# Patient Record
Sex: Female | Born: 1950 | Race: Black or African American | Hispanic: No | Smoking: Never smoker
Health system: Southern US, Community
[De-identification: ages and names within clinical notes are randomized; demographics above are authoritative.]

## PROBLEM LIST (undated history)

## (undated) DIAGNOSIS — M1990 Unspecified osteoarthritis, unspecified site: Secondary | ICD-10-CM

## (undated) DIAGNOSIS — E782 Mixed hyperlipidemia: Secondary | ICD-10-CM

## (undated) DIAGNOSIS — E079 Disorder of thyroid, unspecified: Secondary | ICD-10-CM

## (undated) DIAGNOSIS — R05 Cough: Secondary | ICD-10-CM

## (undated) DIAGNOSIS — I10 Essential (primary) hypertension: Secondary | ICD-10-CM

## (undated) DIAGNOSIS — E039 Hypothyroidism, unspecified: Secondary | ICD-10-CM

## (undated) DIAGNOSIS — D649 Anemia, unspecified: Secondary | ICD-10-CM

## (undated) DIAGNOSIS — E559 Vitamin D deficiency, unspecified: Secondary | ICD-10-CM

## (undated) DIAGNOSIS — B019 Varicella without complication: Secondary | ICD-10-CM

## (undated) HISTORY — DX: Unspecified osteoarthritis, unspecified site: M19.90

## (undated) HISTORY — DX: Hypothyroidism, unspecified: E03.9

## (undated) HISTORY — DX: Mixed hyperlipidemia: E78.2

## (undated) HISTORY — PX: HERNIA REPAIR: SHX51

## (undated) HISTORY — DX: Anemia, unspecified: D64.9

## (undated) HISTORY — DX: Vitamin D deficiency, unspecified: E55.9

## (undated) HISTORY — DX: Essential (primary) hypertension: I10

## (undated) HISTORY — DX: Varicella without complication: B01.9

## (undated) HISTORY — DX: Disorder of thyroid, unspecified: E07.9

## (undated) HISTORY — DX: Cough: R05

---

## 2003-02-27 ENCOUNTER — Other Ambulatory Visit: Admission: RE | Admit: 2003-02-27 | Discharge: 2003-02-27 | Payer: Self-pay | Admitting: Family Medicine

## 2004-03-11 ENCOUNTER — Other Ambulatory Visit: Admission: RE | Admit: 2004-03-11 | Discharge: 2004-03-11 | Payer: Self-pay | Admitting: Family Medicine

## 2005-06-27 ENCOUNTER — Other Ambulatory Visit: Admission: RE | Admit: 2005-06-27 | Discharge: 2005-06-27 | Payer: Self-pay | Admitting: Family Medicine

## 2006-11-25 ENCOUNTER — Other Ambulatory Visit: Admission: RE | Admit: 2006-11-25 | Discharge: 2006-11-25 | Payer: Self-pay | Admitting: Family Medicine

## 2007-01-14 HISTORY — PX: COLONOSCOPY: SHX174

## 2007-11-30 ENCOUNTER — Other Ambulatory Visit: Admission: RE | Admit: 2007-11-30 | Discharge: 2007-11-30 | Payer: Self-pay | Admitting: Family Medicine

## 2009-02-07 ENCOUNTER — Other Ambulatory Visit: Admission: RE | Admit: 2009-02-07 | Discharge: 2009-02-07 | Payer: Self-pay | Admitting: Family Medicine

## 2009-02-07 LAB — HM PAP SMEAR: HM Pap smear: NEGATIVE

## 2010-02-03 ENCOUNTER — Encounter: Payer: Self-pay | Admitting: Family Medicine

## 2010-07-11 ENCOUNTER — Ambulatory Visit
Admission: RE | Admit: 2010-07-11 | Discharge: 2010-07-11 | Disposition: A | Discharge status: Home / Self Care | Payer: No Typology Code available for payment source | Source: Ambulatory Visit | Attending: General Practice | Admitting: General Practice

## 2010-07-11 ENCOUNTER — Other Ambulatory Visit: Payer: Self-pay | Admitting: General Practice

## 2010-07-11 DIAGNOSIS — M542 Cervicalgia: Secondary | ICD-10-CM

## 2010-07-11 DIAGNOSIS — M549 Dorsalgia, unspecified: Secondary | ICD-10-CM

## 2010-07-16 ENCOUNTER — Ambulatory Visit: Payer: Self-pay | Attending: General Practice | Admitting: Physical Therapy

## 2010-07-16 DIAGNOSIS — M542 Cervicalgia: Secondary | ICD-10-CM | POA: Insufficient documentation

## 2010-07-16 DIAGNOSIS — IMO0001 Reserved for inherently not codable concepts without codable children: Secondary | ICD-10-CM | POA: Insufficient documentation

## 2010-07-16 DIAGNOSIS — M545 Low back pain, unspecified: Secondary | ICD-10-CM | POA: Insufficient documentation

## 2010-07-16 DIAGNOSIS — M25519 Pain in unspecified shoulder: Secondary | ICD-10-CM | POA: Insufficient documentation

## 2010-07-19 ENCOUNTER — Ambulatory Visit: Payer: Self-pay | Admitting: Physical Therapy

## 2010-07-23 ENCOUNTER — Ambulatory Visit: Payer: Self-pay | Admitting: Physical Therapy

## 2010-07-25 ENCOUNTER — Ambulatory Visit: Payer: Self-pay | Admitting: Physical Therapy

## 2010-07-30 ENCOUNTER — Ambulatory Visit: Payer: Self-pay | Admitting: Physical Therapy

## 2010-08-01 ENCOUNTER — Ambulatory Visit: Payer: Self-pay | Admitting: Physical Therapy

## 2010-08-06 ENCOUNTER — Ambulatory Visit: Payer: Self-pay | Admitting: Physical Therapy

## 2010-08-08 ENCOUNTER — Ambulatory Visit: Payer: Self-pay | Admitting: Physical Therapy

## 2011-06-14 LAB — HM PAP SMEAR

## 2011-08-29 ENCOUNTER — Ambulatory Visit: Payer: Self-pay | Admitting: Family Medicine

## 2012-06-13 LAB — HM MAMMOGRAPHY

## 2012-07-06 LAB — HM MAMMOGRAPHY: HM Mammogram: NORMAL

## 2012-09-10 ENCOUNTER — Ambulatory Visit (INDEPENDENT_AMBULATORY_CARE_PROVIDER_SITE_OTHER): Payer: BC Managed Care – PPO | Admitting: Family Medicine

## 2012-09-10 ENCOUNTER — Encounter: Payer: Self-pay | Admitting: Family Medicine

## 2012-09-10 ENCOUNTER — Other Ambulatory Visit (HOSPITAL_COMMUNITY)
Admission: RE | Admit: 2012-09-10 | Discharge: 2012-09-10 | Disposition: A | Discharge status: Home / Self Care | Payer: BC Managed Care – PPO | Source: Ambulatory Visit | Attending: Family Medicine | Admitting: Family Medicine

## 2012-09-10 VITALS — BP 116/72 | HR 46 | Temp 98.4°F | Ht 68.0 in | Wt 168.4 lb

## 2012-09-10 DIAGNOSIS — Z1151 Encounter for screening for human papillomavirus (HPV): Secondary | ICD-10-CM | POA: Insufficient documentation

## 2012-09-10 DIAGNOSIS — Z124 Encounter for screening for malignant neoplasm of cervix: Secondary | ICD-10-CM

## 2012-09-10 DIAGNOSIS — I10 Essential (primary) hypertension: Secondary | ICD-10-CM | POA: Insufficient documentation

## 2012-09-10 DIAGNOSIS — Z0000 Encounter for general adult medical examination without abnormal findings: Secondary | ICD-10-CM

## 2012-09-10 DIAGNOSIS — Z01419 Encounter for gynecological examination (general) (routine) without abnormal findings: Secondary | ICD-10-CM | POA: Insufficient documentation

## 2012-09-10 DIAGNOSIS — E039 Hypothyroidism, unspecified: Secondary | ICD-10-CM | POA: Insufficient documentation

## 2012-09-10 LAB — POCT URINALYSIS DIPSTICK
Ketones, UA: NEGATIVE
Protein, UA: NEGATIVE
Spec Grav, UA: 1.01
pH, UA: 8

## 2012-09-10 LAB — BASIC METABOLIC PANEL
BUN: 15 mg/dL (ref 6–23)
CO2: 30 mEq/L (ref 19–32)
Calcium: 9.7 mg/dL (ref 8.4–10.5)
Creatinine, Ser: 1.3 mg/dL — ABNORMAL HIGH (ref 0.4–1.2)
Glucose, Bld: 88 mg/dL (ref 70–99)

## 2012-09-10 LAB — HEPATIC FUNCTION PANEL
Albumin: 4 g/dL (ref 3.5–5.2)
Alkaline Phosphatase: 56 U/L (ref 39–117)
Total Bilirubin: 1.1 mg/dL (ref 0.3–1.2)

## 2012-09-10 LAB — CBC WITH DIFFERENTIAL/PLATELET
Basophils Absolute: 0 10*3/uL (ref 0.0–0.1)
Eosinophils Absolute: 0.1 10*3/uL (ref 0.0–0.7)
Hemoglobin: 12.6 g/dL (ref 12.0–15.0)
Lymphocytes Relative: 31.4 % (ref 12.0–46.0)
MCHC: 34.1 g/dL (ref 30.0–36.0)
Neutro Abs: 2.2 10*3/uL (ref 1.4–7.7)
Platelets: 209 10*3/uL (ref 150.0–400.0)
RDW: 13.9 % (ref 11.5–14.6)

## 2012-09-10 LAB — LIPID PANEL
Cholesterol: 188 mg/dL (ref 0–200)
HDL: 44.9 mg/dL (ref >39.00)
VLDL: 18.4 mg/dL (ref 0.0–40.0)

## 2012-09-10 MED ORDER — BISOPROLOL-HYDROCHLOROTHIAZIDE 2.5-6.25 MG PO TABS: 1.0000 | ORAL_TABLET | Freq: Every day | ORAL | Status: DC

## 2012-09-10 MED ORDER — ASCRIPTIN 325 MG PO TABS: ORAL_TABLET | ORAL | Status: DC

## 2012-09-10 NOTE — Assessment & Plan Note (Signed)
Low today --pt requesting to change to a med that is a combo bb and diuretic ziac per orders rto 2-3 weeks

## 2012-09-10 NOTE — Progress Notes (Signed)
Subjective:     Lydia Joyce is a 62 y.o. female and is here for a comprehensive physical exam. The patient reports no problems.  History   Social History  . Marital Status: Divorced    Spouse Name: N/A    Number of Children: N/A  . Years of Education: N/A   Occupational History  . Not on file.   Social History Main Topics  . Smoking status: Never Smoker   . Smokeless tobacco: Never Used  . Alcohol Use: No  . Drug Use: No  . Sexual Activity: Not Currently    Partners: Male   Other Topics Concern  . Not on file   Social History Narrative   Exercise-- walk, treadmill 2x a week   No health maintenance topics applied.  The following portions of the patient's history were reviewed and updated as appropriate:  She  has a past medical history of Hypertension; Thyroid disease; Arthritis; and Chicken pox. She  does not have any pertinent problems on file. She  has past surgical history that includes Hernia repair. Her family history includes Arthritis in her father and mother; Hypertension in her father; Stroke in her father and mother; Thyroid disease in her sister. She  reports that she has never smoked. She has never used smokeless tobacco. She reports that she does not drink alcohol or use illicit drugs. She has a current medication list which includes the following prescription(s): vitamin d3, iron, levothyroxine, glucosamine chond complex/msm, multivitamin, ascriptin, and bisoprolol-hydrochlorothiazide. No current outpatient prescriptions on file prior to visit.   No current facility-administered medications on file prior to visit.   She is allergic to lisinopril..  Review of Systems Review of Systems  Constitutional: Negative for activity change, appetite change and fatigue.  HENT: Negative for hearing loss, congestion, tinnitus and ear discharge.  dentist q47m Eyes: Negative for visual disturbance (see optho q1y -- vision corrected to 20/20 with glasses).   Respiratory: Negative for cough, chest tightness and shortness of breath.   Cardiovascular: Negative for chest pain, palpitations and leg swelling.  Gastrointestinal: Negative for abdominal pain, diarrhea, constipation and abdominal distention.  Genitourinary: Negative for urgency, frequency, decreased urine volume and difficulty urinating.  Musculoskeletal: Negative for back pain, arthralgias and gait problem.  Skin: Negative for color change, pallor and rash.  Neurological: Negative for dizziness, light-headedness, numbness and headaches.  Hematological: Negative for adenopathy. Does not bruise/bleed easily.  Psychiatric/Behavioral: Negative for suicidal ideas, confusion, sleep disturbance, self-injury, dysphoric mood, decreased concentration and agitation.       Objective:    BP 116/72  Pulse 46  Temp(Src) 98.4 F (36.9 C) (Oral)  Ht 5\' 8"  (1.727 m)  Wt 168 lb 6.4 oz (76.386 kg)  BMI 25.61 kg/m2  SpO2 99% General appearance: alert, cooperative, appears stated age and no distress Head: Normocephalic, without obvious abnormality, atraumatic Eyes: conjunctivae/corneas clear. PERRL, EOM's intact. Fundi benign. Ears: normal TM's and external ear canals both ears Nose: Nares normal. Septum midline. Mucosa normal. No drainage or sinus tenderness. Throat: lips, mucosa, and tongue normal; teeth and gums normal Neck: no adenopathy, no carotid bruit, no JVD, supple, symmetrical, trachea midline and thyroid not enlarged, symmetric, no tenderness/mass/nodules Back: symmetric, no curvature. ROM normal. No CVA tenderness. Lungs: clear to auscultation bilaterally Breasts: normal appearance, no masses or tenderness Heart: regular rate and rhythm, S1, S2 normal, no murmur, click, rub or gallop Abdomen: soft, non-tender; bowel sounds normal; no masses,  no organomegaly Pelvic: cervix normal in appearance, external genitalia  normal, no adnexal masses or tenderness, no cervical motion tenderness,  rectovaginal septum normal, uterus normal size, shape, and consistency and vagina normal without discharge Extremities: extremities normal, atraumatic, no cyanosis or edema Pulses: 2+ and symmetric Skin: Skin color, texture, turgor normal. No rashes or lesions Lymph nodes: Cervical, supraclavicular, and axillary nodes normal. Neurologic: Alert and oriented X 3, normal strength and tone. Normal symmetric reflexes. Normal coordination and gait Psych- no depression, no anxiety       Assessment:    Healthy female exam.       Plan:    ghm utd Check labs See After Visit Summary for Counseling Recommendations

## 2012-09-10 NOTE — Patient Instructions (Signed)
Preventive Care for Adults, Female A healthy lifestyle and preventive care can promote health and wellness. Preventive health guidelines for women include the following key practices.  A routine yearly physical is a good way to check with your caregiver about your health and preventive screening. It is a chance to share any concerns and updates on your health, and to receive a thorough exam.  Visit your dentist for a routine exam and preventive care every 6 months. Brush your teeth twice a day and floss once a day. Good oral hygiene prevents tooth decay and gum disease.  The frequency of eye exams is based on your age, health, family medical history, use of contact lenses, and other factors. Follow your caregiver's recommendations for frequency of eye exams.  Eat a healthy diet. Foods like vegetables, fruits, whole grains, low-fat dairy products, and lean protein foods contain the nutrients you need without too many calories. Decrease your intake of foods high in solid fats, added sugars, and salt. Eat the right amount of calories for you.Get information about a proper diet from your caregiver, if necessary.  Regular physical exercise is one of the most important things you can do for your health. Most adults should get at least 150 minutes of moderate-intensity exercise (any activity that increases your heart rate and causes you to sweat) each week. In addition, most adults need muscle-strengthening exercises on 2 or more days a week.  Maintain a healthy weight. The body mass index (BMI) is a screening tool to identify possible weight problems. It provides an estimate of body fat based on height and weight. Your caregiver can help determine your BMI, and can help you achieve or maintain a healthy weight.For adults 20 years and older:  A BMI below 18.5 is considered underweight.  A BMI of 18.5 to 24.9 is normal.  A BMI of 25 to 29.9 is considered overweight.  A BMI of 30 and above is  considered obese.  Maintain normal blood lipids and cholesterol levels by exercising and minimizing your intake of saturated fat. Eat a balanced diet with plenty of fruit and vegetables. Blood tests for lipids and cholesterol should begin at age 20 and be repeated every 5 years. If your lipid or cholesterol levels are high, you are over 50, or you are at high risk for heart disease, you may need your cholesterol levels checked more frequently.Ongoing high lipid and cholesterol levels should be treated with medicines if diet and exercise are not effective.  If you smoke, find out from your caregiver how to quit. If you do not use tobacco, do not start.  If you are pregnant, do not drink alcohol. If you are breastfeeding, be very cautious about drinking alcohol. If you are not pregnant and choose to drink alcohol, do not exceed 1 drink per day. One drink is considered to be 12 ounces (355 mL) of beer, 5 ounces (148 mL) of wine, or 1.5 ounces (44 mL) of liquor.  Avoid use of street drugs. Do not share needles with anyone. Ask for help if you need support or instructions about stopping the use of drugs.  High blood pressure causes heart disease and increases the risk of stroke. Your blood pressure should be checked at least every 1 to 2 years. Ongoing high blood pressure should be treated with medicines if weight loss and exercise are not effective.  If you are 55 to 60 years old, ask your caregiver if you should take aspirin to prevent strokes.  Diabetes   screening involves taking a blood sample to check your fasting blood sugar level. This should be done once every 3 years, after age 45, if you are within normal weight and without risk factors for diabetes. Testing should be considered at a younger age or be carried out more frequently if you are overweight and have at least 1 risk factor for diabetes.  Breast cancer screening is essential preventive care for women. You should practice "breast  self-awareness." This means understanding the normal appearance and feel of your breasts and may include breast self-examination. Any changes detected, no matter how small, should be reported to a caregiver. Women in their 20s and 30s should have a clinical breast exam (CBE) by a caregiver as part of a regular health exam every 1 to 3 years. After age 40, women should have a CBE every year. Starting at age 40, women should consider having a mammography (breast X-ray test) every year. Women who have a family history of breast cancer should talk to their caregiver about genetic screening. Women at a high risk of breast cancer should talk to their caregivers about having magnetic resonance imaging (MRI) and a mammography every year.  The Pap test is a screening test for cervical cancer. A Pap test can show cell changes on the cervix that might become cervical cancer if left untreated. A Pap test is a procedure in which cells are obtained and examined from the lower end of the uterus (cervix).  Women should have a Pap test starting at age 21.  Between ages 21 and 29, Pap tests should be repeated every 2 years.  Beginning at age 30, you should have a Pap test every 3 years as long as the past 3 Pap tests have been normal.  Some women have medical problems that increase the chance of getting cervical cancer. Talk to your caregiver about these problems. It is especially important to talk to your caregiver if a new problem develops soon after your last Pap test. In these cases, your caregiver may recommend more frequent screening and Pap tests.  The above recommendations are the same for women who have or have not gotten the vaccine for human papillomavirus (HPV).  If you had a hysterectomy for a problem that was not cancer or a condition that could lead to cancer, then you no longer need Pap tests. Even if you no longer need a Pap test, a regular exam is a good idea to make sure no other problems are  starting.  If you are between ages 65 and 70, and you have had normal Pap tests going back 10 years, you no longer need Pap tests. Even if you no longer need a Pap test, a regular exam is a good idea to make sure no other problems are starting.  If you have had past treatment for cervical cancer or a condition that could lead to cancer, you need Pap tests and screening for cancer for at least 20 years after your treatment.  If Pap tests have been discontinued, risk factors (such as a new sexual partner) need to be reassessed to determine if screening should be resumed.  The HPV test is an additional test that may be used for cervical cancer screening. The HPV test looks for the virus that can cause the cell changes on the cervix. The cells collected during the Pap test can be tested for HPV. The HPV test could be used to screen women aged 30 years and older, and should   be used in women of any age who have unclear Pap test results. After the age of 30, women should have HPV testing at the same frequency as a Pap test.  Colorectal cancer can be detected and often prevented. Most routine colorectal cancer screening begins at the age of 50 and continues through age 75. However, your caregiver may recommend screening at an earlier age if you have risk factors for colon cancer. On a yearly basis, your caregiver may provide home test kits to check for hidden blood in the stool. Use of a small camera at the end of a tube, to directly examine the colon (sigmoidoscopy or colonoscopy), can detect the earliest forms of colorectal cancer. Talk to your caregiver about this at age 50, when routine screening begins. Direct examination of the colon should be repeated every 5 to 10 years through age 75, unless early forms of pre-cancerous polyps or small growths are found.  Hepatitis C blood testing is recommended for all people born from 1945 through 1965 and any individual with known risks for hepatitis C.  Practice  safe sex. Use condoms and avoid high-risk sexual practices to reduce the spread of sexually transmitted infections (STIs). STIs include gonorrhea, chlamydia, syphilis, trichomonas, herpes, HPV, and human immunodeficiency virus (HIV). Herpes, HIV, and HPV are viral illnesses that have no cure. They can result in disability, cancer, and death. Sexually active women aged 25 and younger should be checked for chlamydia. Older women with new or multiple partners should also be tested for chlamydia. Testing for other STIs is recommended if you are sexually active and at increased risk.  Osteoporosis is a disease in which the bones lose minerals and strength with aging. This can result in serious bone fractures. The risk of osteoporosis can be identified using a bone density scan. Women ages 65 and over and women at risk for fractures or osteoporosis should discuss screening with their caregivers. Ask your caregiver whether you should take a calcium supplement or vitamin D to reduce the rate of osteoporosis.  Menopause can be associated with physical symptoms and risks. Hormone replacement therapy is available to decrease symptoms and risks. You should talk to your caregiver about whether hormone replacement therapy is right for you.  Use sunscreen with sun protection factor (SPF) of 30 or more. Apply sunscreen liberally and repeatedly throughout the day. You should seek shade when your shadow is shorter than you. Protect yourself by wearing long sleeves, pants, a wide-brimmed hat, and sunglasses year round, whenever you are outdoors.  Once a month, do a whole body skin exam, using a mirror to look at the skin on your back. Notify your caregiver of new moles, moles that have irregular borders, moles that are larger than a pencil eraser, or moles that have changed in shape or color.  Stay current with required immunizations.  Influenza. You need a dose every fall (or winter). The composition of the flu vaccine  changes each year, so being vaccinated once is not enough.  Pneumococcal polysaccharide. You need 1 to 2 doses if you smoke cigarettes or if you have certain chronic medical conditions. You need 1 dose at age 65 (or older) if you have never been vaccinated.  Tetanus, diphtheria, pertussis (Tdap, Td). Get 1 dose of Tdap vaccine if you are younger than age 65, are over 65 and have contact with an infant, are a healthcare worker, are pregnant, or simply want to be protected from whooping cough. After that, you need a Td   booster dose every 10 years. Consult your caregiver if you have not had at least 3 tetanus and diphtheria-containing shots sometime in your life or have a deep or dirty wound.  HPV. You need this vaccine if you are a woman age 26 or younger. The vaccine is given in 3 doses over 6 months.  Measles, mumps, rubella (MMR). You need at least 1 dose of MMR if you were born in 1957 or later. You may also need a second dose.  Meningococcal. If you are age 19 to 21 and a first-year college student living in a residence hall, or have one of several medical conditions, you need to get vaccinated against meningococcal disease. You may also need additional booster doses.  Zoster (shingles). If you are age 60 or older, you should get this vaccine.  Varicella (chickenpox). If you have never had chickenpox or you were vaccinated but received only 1 dose, talk to your caregiver to find out if you need this vaccine.  Hepatitis A. You need this vaccine if you have a specific risk factor for hepatitis A virus infection or you simply wish to be protected from this disease. The vaccine is usually given as 2 doses, 6 to 18 months apart.  Hepatitis B. You need this vaccine if you have a specific risk factor for hepatitis B virus infection or you simply wish to be protected from this disease. The vaccine is given in 3 doses, usually over 6 months. Preventive Services / Frequency Ages 19 to 39  Blood  pressure check.** / Every 1 to 2 years.  Lipid and cholesterol check.** / Every 5 years beginning at age 20.  Clinical breast exam.** / Every 3 years for women in their 20s and 30s.  Pap test.** / Every 2 years from ages 21 through 29. Every 3 years starting at age 30 through age 65 or 70 with a history of 3 consecutive normal Pap tests.  HPV screening.** / Every 3 years from ages 30 through ages 65 to 70 with a history of 3 consecutive normal Pap tests.  Hepatitis C blood test.** / For any individual with known risks for hepatitis C.  Skin self-exam. / Monthly.  Influenza immunization.** / Every year.  Pneumococcal polysaccharide immunization.** / 1 to 2 doses if you smoke cigarettes or if you have certain chronic medical conditions.  Tetanus, diphtheria, pertussis (Tdap, Td) immunization. / A one-time dose of Tdap vaccine. After that, you need a Td booster dose every 10 years.  HPV immunization. / 3 doses over 6 months, if you are 26 and younger.  Measles, mumps, rubella (MMR) immunization. / You need at least 1 dose of MMR if you were born in 1957 or later. You may also need a second dose.  Meningococcal immunization. / 1 dose if you are age 19 to 21 and a first-year college student living in a residence hall, or have one of several medical conditions, you need to get vaccinated against meningococcal disease. You may also need additional booster doses.  Varicella immunization.** / Consult your caregiver.  Hepatitis A immunization.** / Consult your caregiver. 2 doses, 6 to 18 months apart.  Hepatitis B immunization.** / Consult your caregiver. 3 doses usually over 6 months. Ages 40 to 64  Blood pressure check.** / Every 1 to 2 years.  Lipid and cholesterol check.** / Every 5 years beginning at age 20.  Clinical breast exam.** / Every year after age 40.  Mammogram.** / Every year beginning at age 40   and continuing for as long as you are in good health. Consult with your  caregiver.  Pap test.** / Every 3 years starting at age 30 through age 65 or 70 with a history of 3 consecutive normal Pap tests.  HPV screening.** / Every 3 years from ages 30 through ages 65 to 70 with a history of 3 consecutive normal Pap tests.  Fecal occult blood test (FOBT) of stool. / Every year beginning at age 50 and continuing until age 75. You may not need to do this test if you get a colonoscopy every 10 years.  Flexible sigmoidoscopy or colonoscopy.** / Every 5 years for a flexible sigmoidoscopy or every 10 years for a colonoscopy beginning at age 50 and continuing until age 75.  Hepatitis C blood test.** / For all people born from 1945 through 1965 and any individual with known risks for hepatitis C.  Skin self-exam. / Monthly.  Influenza immunization.** / Every year.  Pneumococcal polysaccharide immunization.** / 1 to 2 doses if you smoke cigarettes or if you have certain chronic medical conditions.  Tetanus, diphtheria, pertussis (Tdap, Td) immunization.** / A one-time dose of Tdap vaccine. After that, you need a Td booster dose every 10 years.  Measles, mumps, rubella (MMR) immunization. / You need at least 1 dose of MMR if you were born in 1957 or later. You may also need a second dose.  Varicella immunization.** / Consult your caregiver.  Meningococcal immunization.** / Consult your caregiver.  Hepatitis A immunization.** / Consult your caregiver. 2 doses, 6 to 18 months apart.  Hepatitis B immunization.** / Consult your caregiver. 3 doses, usually over 6 months. Ages 65 and over  Blood pressure check.** / Every 1 to 2 years.  Lipid and cholesterol check.** / Every 5 years beginning at age 20.  Clinical breast exam.** / Every year after age 40.  Mammogram.** / Every year beginning at age 40 and continuing for as long as you are in good health. Consult with your caregiver.  Pap test.** / Every 3 years starting at age 30 through age 65 or 70 with a 3  consecutive normal Pap tests. Testing can be stopped between 65 and 70 with 3 consecutive normal Pap tests and no abnormal Pap or HPV tests in the past 10 years.  HPV screening.** / Every 3 years from ages 30 through ages 65 or 70 with a history of 3 consecutive normal Pap tests. Testing can be stopped between 65 and 70 with 3 consecutive normal Pap tests and no abnormal Pap or HPV tests in the past 10 years.  Fecal occult blood test (FOBT) of stool. / Every year beginning at age 50 and continuing until age 75. You may not need to do this test if you get a colonoscopy every 10 years.  Flexible sigmoidoscopy or colonoscopy.** / Every 5 years for a flexible sigmoidoscopy or every 10 years for a colonoscopy beginning at age 50 and continuing until age 75.  Hepatitis C blood test.** / For all people born from 1945 through 1965 and any individual with known risks for hepatitis C.  Osteoporosis screening.** / A one-time screening for women ages 65 and over and women at risk for fractures or osteoporosis.  Skin self-exam. / Monthly.  Influenza immunization.** / Every year.  Pneumococcal polysaccharide immunization.** / 1 dose at age 65 (or older) if you have never been vaccinated.  Tetanus, diphtheria, pertussis (Tdap, Td) immunization. / A one-time dose of Tdap vaccine if you are over   65 and have contact with an infant, are a healthcare worker, or simply want to be protected from whooping cough. After that, you need a Td booster dose every 10 years.  Varicella immunization.** / Consult your caregiver.  Meningococcal immunization.** / Consult your caregiver.  Hepatitis A immunization.** / Consult your caregiver. 2 doses, 6 to 18 months apart.  Hepatitis B immunization.** / Check with your caregiver. 3 doses, usually over 6 months. ** Family history and personal history of risk and conditions may change your caregiver's recommendations. Document Released: 02/25/2001 Document Revised: 03/24/2011  Document Reviewed: 05/27/2010 [employer] Patient Information 2014 [employer].  

## 2012-09-10 NOTE — Assessment & Plan Note (Signed)
Check labs Cont' meds 

## 2012-09-14 ENCOUNTER — Telehealth: Payer: Self-pay | Admitting: Family Medicine

## 2012-09-14 DIAGNOSIS — R7989 Other specified abnormal findings of blood chemistry: Secondary | ICD-10-CM

## 2012-09-14 MED ORDER — LEVOTHYROXINE SODIUM 50 MCG PO TABS: 50.0000 ug | ORAL_TABLET | Freq: Every day | ORAL | Status: DC

## 2012-09-14 NOTE — Telephone Encounter (Signed)
Patient is calling to request a refill on her Levothyroxine rx. Says she has two days of it left. Please advise.

## 2012-09-14 NOTE — Telephone Encounter (Signed)
Notes Recorded by Lelon Perla, DO on 09/11/2012 at 12:52 PM Cr elevated----inc po fluids  Rest of labs are good Recheck bmp in 1 month  Discussed with patient and Rx sent, apt has been scheduled for repeat labs.    KP

## 2012-09-15 ENCOUNTER — Telehealth: Payer: Self-pay | Admitting: Family Medicine

## 2012-09-15 MED ORDER — LEVOTHYROXINE SODIUM 50 MCG PO TABS: 50.0000 ug | ORAL_TABLET | Freq: Every day | ORAL | Status: DC

## 2012-09-15 NOTE — Telephone Encounter (Signed)
Pt called and stated that the pharmacy we have on file she would like to change because they do not have a 90 day supply for her (levothyroxine). She would like for it to be called into [facility] on [address] for her rx levothyroxine (SYNTHROID, LEVOTHROID) 50 MCG tablet. thanks

## 2012-09-28 ENCOUNTER — Telehealth: Payer: Self-pay | Admitting: Family Medicine

## 2012-09-28 NOTE — Telephone Encounter (Signed)
[facility]

## 2012-10-14 ENCOUNTER — Encounter: Payer: Self-pay | Admitting: Family Medicine

## 2012-10-14 ENCOUNTER — Ambulatory Visit (INDEPENDENT_AMBULATORY_CARE_PROVIDER_SITE_OTHER): Payer: BC Managed Care – PPO | Admitting: Family Medicine

## 2012-10-14 ENCOUNTER — Telehealth: Payer: Self-pay | Admitting: Family Medicine

## 2012-10-14 ENCOUNTER — Other Ambulatory Visit (INDEPENDENT_AMBULATORY_CARE_PROVIDER_SITE_OTHER): Payer: BC Managed Care – PPO

## 2012-10-14 VITALS — BP 130/74 | HR 40 | Temp 98.0°F | Wt 171.4 lb

## 2012-10-14 DIAGNOSIS — I10 Essential (primary) hypertension: Secondary | ICD-10-CM

## 2012-10-14 DIAGNOSIS — R799 Abnormal finding of blood chemistry, unspecified: Secondary | ICD-10-CM

## 2012-10-14 DIAGNOSIS — R7989 Other specified abnormal findings of blood chemistry: Secondary | ICD-10-CM

## 2012-10-14 LAB — BASIC METABOLIC PANEL
Chloride: 100 mEq/L (ref 96–112)
GFR: 57.26 mL/min — ABNORMAL LOW (ref >60.00)
Potassium: 3.6 mEq/L (ref 3.5–5.1)
Sodium: 136 mEq/L (ref 135–145)

## 2012-10-14 MED ORDER — BISOPROLOL-HYDROCHLOROTHIAZIDE 2.5-6.25 MG PO TABS: 1.0000 | ORAL_TABLET | Freq: Every day | ORAL | Status: DC

## 2012-10-14 NOTE — Telephone Encounter (Signed)
Patient will be seen now.    KP

## 2012-10-14 NOTE — Patient Instructions (Signed)

## 2012-10-14 NOTE — Telephone Encounter (Signed)
Per last office note, pt was supposed to con't med and rto 2-3 weeks to check bp

## 2012-10-14 NOTE — Telephone Encounter (Signed)
Patient would like a nurse call concerning her blood pressure medicine. Thanks

## 2012-10-14 NOTE — Progress Notes (Signed)
  Subjective:    Patient here for follow-up of elevated blood pressure.  She is exercising and is adherent to a low-salt diet.  Blood pressure is well controlled at home. Cardiac symptoms: none. Patient denies: chest pain, chest pressure/discomfort, claudication, dyspnea, exertional chest pressure/discomfort, fatigue, irregular heart beat, lower extremity edema, near-syncope, orthopnea, palpitations, paroxysmal nocturnal dyspnea, syncope and tachypnea. Cardiovascular risk factors: hypertension. Use of agents associated with hypertension: none. History of target organ damage: none.  The following portions of the patient's history were reviewed and updated as appropriate: allergies, current medications, past family history, past medical history, past social history, past surgical history and problem list.  Review of Systems Pertinent items are noted in HPI.     Objective:    BP 130/74  Pulse 40  Temp(Src) 98 F (36.7 C) (Oral)  Wt 171 lb 6.4 oz (77.747 kg)  BMI 26.07 kg/m2  SpO2 99% General appearance: alert, cooperative, appears stated age and no distress Throat: lips, mucosa, and tongue normal; teeth and gums normal Neck: no adenopathy, supple, symmetrical, trachea midline and thyroid not enlarged, symmetric, no tenderness/mass/nodules Lungs: clear to auscultation bilaterally Heart: S1, S2 normal Extremities: extremities normal, atraumatic, no cyanosis or edema    Assessment:    Hypertension, normal blood pressure . Evidence of target organ damage: none.    Plan:    Medication: no change. Dietary sodium restriction. Regular aerobic exercise. Check blood pressures 2-3 times weekly and record. Follow up: 6 months and as needed.

## 2012-10-14 NOTE — Telephone Encounter (Signed)
Pt stated that Dr. Laury Axon had prescribed her a new blood pressure medication (bisoprolol-hydrochlorothiazide (ZIAC) 2.5-6.25 MG). Pt states that she was only suppose to try it for 30 days. The Rx has 2 refills on it. Patient not sure if she should continue the medication. Pateint states that she feels fine and haven't had any issues with the medication. Patient is coming in today for labs and would like to now then what she needs to do.

## 2012-11-18 ENCOUNTER — Other Ambulatory Visit: Payer: Self-pay

## 2012-11-22 ENCOUNTER — Encounter: Payer: Self-pay | Admitting: Family Medicine

## 2012-11-22 ENCOUNTER — Ambulatory Visit (INDEPENDENT_AMBULATORY_CARE_PROVIDER_SITE_OTHER): Payer: BC Managed Care – PPO | Admitting: Family Medicine

## 2012-11-22 VITALS — BP 130/70 | HR 65 | Temp 99.4°F | Resp 16 | Wt 166.0 lb

## 2012-11-22 DIAGNOSIS — J069 Acute upper respiratory infection, unspecified: Secondary | ICD-10-CM

## 2012-11-22 DIAGNOSIS — R1013 Epigastric pain: Secondary | ICD-10-CM

## 2012-11-22 MED ORDER — ONDANSETRON 4 MG PO TBDP: 4.0000 mg | ORAL_TABLET | Freq: Three times a day (TID) | ORAL | Status: DC | PRN

## 2012-11-22 MED ORDER — RANITIDINE HCL 150 MG PO TABS: 150.0000 mg | ORAL_TABLET | Freq: Two times a day (BID) | ORAL | Status: DC

## 2012-11-22 NOTE — Progress Notes (Signed)
  Subjective:    Patient ID: Lydia Joyce, female    DOB: April 05, 1950, 62 y.o.   MRN: [id number]  HPI URI- sxs started 3 weeks ago.  + sick contacts at work.  Started w/ hoarseness, nasal congestion x1 week.  Cough- productive.  + vomiting of phlegm.  Developed epigastric pain during religious fast.  Continues to have nasal congestion.  Decreased appetite.  Drank 2 Boost today, developed abdominal cramping and diarrhea.  + fatigue.   Review of Systems For ROS see HPI     Objective:   Physical Exam  Vitals reviewed. Constitutional: She is oriented to person, place, and time. She appears well-developed and well-nourished. No distress.  HENT:  Head: Normocephalic and atraumatic.  Right Ear: Tympanic membrane normal.  Left Ear: Tympanic membrane normal.  Nose: Mucosal edema and rhinorrhea present. Right sinus exhibits no maxillary sinus tenderness and no frontal sinus tenderness. Left sinus exhibits no maxillary sinus tenderness and no frontal sinus tenderness.  Mouth/Throat: Mucous membranes are normal. Posterior oropharyngeal erythema (w/ PND) present.  Eyes: Conjunctivae and EOM are normal. Pupils are equal, round, and reactive to light.  Neck: Normal range of motion. Neck supple.  Cardiovascular: Normal rate, regular rhythm and normal heart sounds.   Pulmonary/Chest: Effort normal and breath sounds normal. No respiratory distress. She has no wheezes. She has no rales.  Abdominal: Soft. Bowel sounds are normal. She exhibits no distension. There is tenderness (mild epigastric tenderness). There is no rebound and no guarding.  Lymphadenopathy:    She has no cervical adenopathy.  Neurological: She is alert and oriented to person, place, and time.  Skin: Skin is warm and dry.          Assessment & Plan:

## 2012-11-22 NOTE — Patient Instructions (Signed)
Follow up as needed We'll notify you of your lab results and make any changes if needed Drink plenty of fluids Start the Zantac twice daily to decrease the acid production Use the Zofran as needed for nausea Call with any questions or concerns Hang in there!!

## 2012-11-23 LAB — CBC WITH DIFFERENTIAL/PLATELET
Basophils Relative: 0.3 % (ref 0.0–3.0)
Eosinophils Relative: 1.2 % (ref 0.0–5.0)
HCT: 33.4 % — ABNORMAL LOW (ref 36.0–46.0)
Hemoglobin: 11.2 g/dL — ABNORMAL LOW (ref 12.0–15.0)
Lymphs Abs: 1.1 10*3/uL (ref 0.7–4.0)
Monocytes Relative: 14.8 % — ABNORMAL HIGH (ref 3.0–12.0)
Neutro Abs: 6.9 10*3/uL (ref 1.4–7.7)
Platelets: 264 10*3/uL (ref 150.0–400.0)
RBC: 3.9 Mil/uL (ref 3.87–5.11)
WBC: 9.5 10*3/uL (ref 4.5–10.5)

## 2012-11-23 LAB — H. PYLORI ANTIBODY, IGG: H Pylori IgG: NEGATIVE

## 2012-11-23 NOTE — Assessment & Plan Note (Signed)
New.  Resolving spontaneously.  Reviewed supportive care.

## 2012-11-23 NOTE — Assessment & Plan Note (Addendum)
New.  Suspect this is due to GERD.  Start Zantac.  Reviewed lifestyle modifications.  Check labs to r/o H pylori and CBC to r/o infxn.

## 2013-03-12 ENCOUNTER — Other Ambulatory Visit: Payer: Self-pay | Admitting: Family Medicine

## 2013-03-28 ENCOUNTER — Ambulatory Visit: Payer: BC Managed Care – PPO

## 2013-03-28 VITALS — BP 131/69

## 2013-03-28 DIAGNOSIS — I10 Essential (primary) hypertension: Secondary | ICD-10-CM

## 2013-03-28 NOTE — Progress Notes (Signed)
Patient ID: Lydia Joyce, female   DOB: 09-Dec-1950, 63 y.o.   MRN: [id number] Patient presents for blood pressure. States that she takes her BP at different places and it is not consistent. Wanted to be sure that it was not too high. BP evaluated.

## 2013-05-30 NOTE — Telephone Encounter (Signed)
error 

## 2013-07-08 ENCOUNTER — Other Ambulatory Visit: Payer: Self-pay | Admitting: Family Medicine

## 2013-09-05 ENCOUNTER — Other Ambulatory Visit: Payer: Self-pay | Admitting: Family Medicine

## 2013-10-07 ENCOUNTER — Telehealth: Payer: Self-pay

## 2013-10-07 MED ORDER — BISOPROLOL-HYDROCHLOROTHIAZIDE 2.5-6.25 MG PO TABS: ORAL_TABLET | ORAL | Status: DC

## 2013-10-07 NOTE — Telephone Encounter (Signed)
Rx faxed.    KP 

## 2013-10-07 NOTE — Telephone Encounter (Signed)
[place] [phone] [place] needs a refill bisoprolol-hydrochlorothiazide (ZIAC) 2.5-6.25 MG per tablet called in to [facility].

## 2013-10-11 ENCOUNTER — Other Ambulatory Visit: Payer: Self-pay

## 2013-10-11 NOTE — Telephone Encounter (Signed)
Rx sent on 10/07/13. Duplicate request.     KP

## 2013-11-21 ENCOUNTER — Ambulatory Visit (INDEPENDENT_AMBULATORY_CARE_PROVIDER_SITE_OTHER): Payer: BC Managed Care – PPO | Admitting: Family Medicine

## 2013-11-21 ENCOUNTER — Encounter: Payer: Self-pay | Admitting: Family Medicine

## 2013-11-21 ENCOUNTER — Other Ambulatory Visit (HOSPITAL_COMMUNITY)
Admission: RE | Admit: 2013-11-21 | Discharge: 2013-11-21 | Disposition: A | Discharge status: Home / Self Care | Payer: BC Managed Care – PPO | Source: Ambulatory Visit | Attending: Family Medicine | Admitting: Family Medicine

## 2013-11-21 VITALS — BP 112/72 | HR 42 | Temp 98.3°F | Ht 68.0 in | Wt 171.6 lb

## 2013-11-21 DIAGNOSIS — E038 Other specified hypothyroidism: Secondary | ICD-10-CM

## 2013-11-21 DIAGNOSIS — Z124 Encounter for screening for malignant neoplasm of cervix: Secondary | ICD-10-CM

## 2013-11-21 DIAGNOSIS — Z0000 Encounter for general adult medical examination without abnormal findings: Secondary | ICD-10-CM

## 2013-11-21 DIAGNOSIS — Z01419 Encounter for gynecological examination (general) (routine) without abnormal findings: Secondary | ICD-10-CM | POA: Insufficient documentation

## 2013-11-21 DIAGNOSIS — Z1151 Encounter for screening for human papillomavirus (HPV): Secondary | ICD-10-CM | POA: Diagnosis present

## 2013-11-21 DIAGNOSIS — I10 Essential (primary) hypertension: Secondary | ICD-10-CM

## 2013-11-21 NOTE — Progress Notes (Signed)
Pre visit review using our clinic review tool, if applicable. No additional management support is needed unless otherwise documented below in the visit note. 

## 2013-11-21 NOTE — Progress Notes (Signed)
Subjective:     Lydia Joyce is a 63 y.o. female and is here for a comprehensive physical exam. The patient reports no problems.  History   Social History  . Marital Status: Divorced    Spouse Name: N/A    Number of Children: N/A  . Years of Education: N/A   Occupational History  . Not on file.   Social History Main Topics  . Smoking status: Never Smoker   . Smokeless tobacco: Never Used  . Alcohol Use: No  . Drug Use: No  . Sexual Activity:    Partners: Male   Other Topics Concern  . Not on file   Social History Narrative   Exercise-- walk, treadmill 2x a week   Health Maintenance  Topic Date Due  . ZOSTAVAX  09/19/2012  . PAP SMEAR  09/10/2013  . INFLUENZA VACCINE  08/14/2014  . MAMMOGRAM  11/10/2014  . COLONOSCOPY  11/04/2017  . TETANUS/TDAP  06/02/2020    The following portions of the patient's history were reviewed and updated as appropriate:  She  has a past medical history of Hypertension; Thyroid disease; Arthritis; and Chicken pox. She  does not have any pertinent problems on file. She  has past surgical history that includes Hernia repair. Her family history includes Arthritis in her father and mother; Hypertension in her father; Stroke in her father and mother; Thyroid disease in her sister. She  reports that she has never smoked. She has never used smokeless tobacco. She reports that she does not drink alcohol or use illicit drugs. She has a current medication list which includes the following prescription(s): ascriptin, bisoprolol-hydrochlorothiazide, vitamin d3, iron, levothyroxine, glucosamine chond complex/msm, and multivitamin. Current Outpatient Prescriptions on File Prior to Visit  Medication Sig Dispense Refill  . Aspirin Buf,AlHyd-MgHyd-CaCar, (ASCRIPTIN) 325 MG TABS 1 po qd  0  . bisoprolol-hydrochlorothiazide (ZIAC) 2.5-6.25 MG per tablet 1 tab by mouth daily--please keep appointment 90 tablet 0  . Cholecalciferol (VITAMIN D3) 5000 UNITS  CAPS Take by mouth.    . Ferrous Sulfate (IRON) 325 (65 FE) MG TABS Take 1 tablet by mouth daily.    Marland Kitchen levothyroxine (SYNTHROID, LEVOTHROID) 50 MCG tablet 1 tab by mouth daily--- office visit with labs are due now 90 tablet 0  . Misc Natural Products (GLUCOSAMINE CHOND COMPLEX/MSM) TABS Take 1 tablet by mouth daily.    . Multiple Vitamin (MULTIVITAMIN) tablet Take 1 tablet by mouth daily.     No current facility-administered medications on file prior to visit.   She is allergic to lisinopril..  Review of Systems Review of Systems  Constitutional: Negative for activity change, appetite change and fatigue.  HENT: Negative for hearing loss, congestion, tinnitus and ear discharge.  dentist q84m Eyes: Negative for visual disturbance (see optho q1y -- vision corrected to 20/20 with glasses).  Respiratory: Negative for cough, chest tightness and shortness of breath.   Cardiovascular: Negative for chest pain, palpitations and leg swelling.  Gastrointestinal: Negative for abdominal pain, diarrhea, constipation and abdominal distention.  Genitourinary: Negative for urgency, frequency, decreased urine volume and difficulty urinating.  Musculoskeletal: Negative for back pain, arthralgias and gait problem.  Skin: Negative for color change, pallor and rash.  Neurological: Negative for dizziness, light-headedness, numbness and headaches.  Hematological: Negative for adenopathy. Does not bruise/bleed easily.  Psychiatric/Behavioral: Negative for suicidal ideas, confusion, sleep disturbance, self-injury, dysphoric mood, decreased concentration and agitation.       Objective:    BP 112/72 mmHg  Pulse 42  Temp(Src) 98.3 F (36.8 C) (Oral)  Ht 5\' 8"  (1.727 m)  Wt 171 lb 9.6 oz (77.837 kg)  BMI 26.10 kg/m2  SpO2 99% General appearance: alert, cooperative, appears stated age and no distress Head: Normocephalic, without obvious abnormality, atraumatic Eyes: conjunctivae/corneas clear. PERRL, EOM's  intact. Fundi benign. Ears: normal TM's and external ear canals both ears Nose: Nares normal. Septum midline. Mucosa normal. No drainage or sinus tenderness. Throat: lips, mucosa, and tongue normal; teeth and gums normal Neck: no adenopathy, no carotid bruit, no JVD, supple, symmetrical, trachea midline and thyroid not enlarged, symmetric, no tenderness/mass/nodules Back: symmetric, no curvature. ROM normal. No CVA tenderness. Lungs: clear to auscultation bilaterally Breasts: normal appearance, no masses or tenderness Heart: S1, S2 normal Abdomen: soft, non-tender; bowel sounds normal; no masses,  no organomegaly Pelvic: cervix normal in appearance, external genitalia normal, no adnexal masses or tenderness, no cervical motion tenderness, rectovaginal septum normal, uterus normal size, shape, and consistency, vagina normal without discharge and pap done Extremities: extremities normal, atraumatic, no cyanosis or edema Pulses: 2+ and symmetric Skin: Skin color, texture, turgor normal. No rashes or lesions Lymph nodes: Cervical, supraclavicular, and axillary nodes normal. Neurologic: Alert and oriented X 3, normal strength and tone. Normal symmetric reflexes. Normal coordination and gait Psych- no depression, no anxiety      Assessment:    Healthy female exam.      Plan:     1. Preventative health care ghm utd Check labs See avs   - Basic metabolic panel; Future - CBC with Differential; Future - Hepatic function panel; Future - Lipid panel; Future - POCT urinalysis dipstick; Future - TSH; Future  2. Essential hypertension Stable, con't meds - Basic metabolic panel; Future - CBC with Differential; Future - POCT urinalysis dipstick; Future  3. Other specified hypothyroidism Check labs - TSH; Future  4. Screening for malignant neoplasm of cervix   - Cytology - PAP  See After Visit Summary for Counseling Recommendations

## 2013-11-21 NOTE — Patient Instructions (Signed)
Preventive Care for Adults A healthy lifestyle and preventive care can promote health and wellness. Preventive health guidelines for women include the following key practices.  A routine yearly physical is a good way to check with your health care provider about your health and preventive screening. It is a chance to share any concerns and updates on your health and to receive a thorough exam.  Visit your dentist for a routine exam and preventive care every 6 months. Brush your teeth twice a day and floss once a day. Good oral hygiene prevents tooth decay and gum disease.  The frequency of eye exams is based on your age, health, family medical history, use of contact lenses, and other factors. Follow your health care provider's recommendations for frequency of eye exams.  Eat a healthy diet. Foods like vegetables, fruits, whole grains, low-fat dairy products, and lean protein foods contain the nutrients you need without too many calories. Decrease your intake of foods high in solid fats, added sugars, and salt. Eat the right amount of calories for you.Get information about a proper diet from your health care provider, if necessary.  Regular physical exercise is one of the most important things you can do for your health. Most adults should get at least 150 minutes of moderate-intensity exercise (any activity that increases your heart rate and causes you to sweat) each week. In addition, most adults need muscle-strengthening exercises on 2 or more days a week.  Maintain a healthy weight. The body mass index (BMI) is a screening tool to identify possible weight problems. It provides an estimate of body fat based on height and weight. Your health care provider can find your BMI and can help you achieve or maintain a healthy weight.For adults 20 years and older:  A BMI below 18.5 is considered underweight.  A BMI of 18.5 to 24.9 is normal.  A BMI of 25 to 29.9 is considered overweight.  A BMI of  30 and above is considered obese.  Maintain normal blood lipids and cholesterol levels by exercising and minimizing your intake of saturated fat. Eat a balanced diet with plenty of fruit and vegetables. Blood tests for lipids and cholesterol should begin at age 76 and be repeated every 5 years. If your lipid or cholesterol levels are high, you are over 50, or you are at high risk for heart disease, you may need your cholesterol levels checked more frequently.Ongoing high lipid and cholesterol levels should be treated with medicines if diet and exercise are not working.  If you smoke, find out from your health care provider how to quit. If you do not use tobacco, do not start.  Lung cancer screening is recommended for adults aged 22-80 years who are at high risk for developing lung cancer because of a history of smoking. A yearly low-dose CT scan of the lungs is recommended for people who have at least a 30-pack-year history of smoking and are a current smoker or have quit within the past 15 years. A pack year of smoking is smoking an average of 1 pack of cigarettes a day for 1 year (for example: 1 pack a day for 30 years or 2 packs a day for 15 years). Yearly screening should continue until the smoker has stopped smoking for at least 15 years. Yearly screening should be stopped for people who develop a health problem that would prevent them from having lung cancer treatment.  If you are pregnant, do not drink alcohol. If you are breastfeeding,  be very cautious about drinking alcohol. If you are not pregnant and choose to drink alcohol, do not have more than 1 drink per day. One drink is considered to be 12 ounces (355 mL) of beer, 5 ounces (148 mL) of wine, or 1.5 ounces (44 mL) of liquor.  Avoid use of street drugs. Do not share needles with anyone. Ask for help if you need support or instructions about stopping the use of drugs.  High blood pressure causes heart disease and increases the risk of  stroke. Your blood pressure should be checked at least every 1 to 2 years. Ongoing high blood pressure should be treated with medicines if weight loss and exercise do not work.  If you are 75-52 years old, ask your health care provider if you should take aspirin to prevent strokes.  Diabetes screening involves taking a blood sample to check your fasting blood sugar level. This should be done once every 3 years, after age 15, if you are within normal weight and without risk factors for diabetes. Testing should be considered at a younger age or be carried out more frequently if you are overweight and have at least 1 risk factor for diabetes.  Breast cancer screening is essential preventive care for women. You should practice "breast self-awareness." This means understanding the normal appearance and feel of your breasts and may include breast self-examination. Any changes detected, no matter how small, should be reported to a health care provider. Women in their 58s and 30s should have a clinical breast exam (CBE) by a health care provider as part of a regular health exam every 1 to 3 years. After age 16, women should have a CBE every year. Starting at age 53, women should consider having a mammogram (breast X-ray test) every year. Women who have a family history of breast cancer should talk to their health care provider about genetic screening. Women at a high risk of breast cancer should talk to their health care providers about having an MRI and a mammogram every year.  Breast cancer gene (BRCA)-related cancer risk assessment is recommended for women who have family members with BRCA-related cancers. BRCA-related cancers include breast, ovarian, tubal, and peritoneal cancers. Having family members with these cancers may be associated with an increased risk for harmful changes (mutations) in the breast cancer genes BRCA1 and BRCA2. Results of the assessment will determine the need for genetic counseling and  BRCA1 and BRCA2 testing.  Routine pelvic exams to screen for cancer are no longer recommended for nonpregnant women who are considered low risk for cancer of the pelvic organs (ovaries, uterus, and vagina) and who do not have symptoms. Ask your health care provider if a screening pelvic exam is right for you.  If you have had past treatment for cervical cancer or a condition that could lead to cancer, you need Pap tests and screening for cancer for at least 20 years after your treatment. If Pap tests have been discontinued, your risk factors (such as having a new sexual partner) need to be reassessed to determine if screening should be resumed. Some women have medical problems that increase the chance of getting cervical cancer. In these cases, your health care provider may recommend more frequent screening and Pap tests.  The HPV test is an additional test that may be used for cervical cancer screening. The HPV test looks for the virus that can cause the cell changes on the cervix. The cells collected during the Pap test can be  tested for HPV. The HPV test could be used to screen women aged 30 years and older, and should be used in women of any age who have unclear Pap test results. After the age of 30, women should have HPV testing at the same frequency as a Pap test.  Colorectal cancer can be detected and often prevented. Most routine colorectal cancer screening begins at the age of 50 years and continues through age 75 years. However, your health care provider may recommend screening at an earlier age if you have risk factors for colon cancer. On a yearly basis, your health care provider may provide home test kits to check for hidden blood in the stool. Use of a small camera at the end of a tube, to directly examine the colon (sigmoidoscopy or colonoscopy), can detect the earliest forms of colorectal cancer. Talk to your health care provider about this at age 50, when routine screening begins. Direct  exam of the colon should be repeated every 5-10 years through age 75 years, unless early forms of pre-cancerous polyps or small growths are found.  People who are at an increased risk for hepatitis B should be screened for this virus. You are considered at high risk for hepatitis B if:  You were born in a country where hepatitis B occurs often. Talk with your health care provider about which countries are considered high risk.  Your parents were born in a high-risk country and you have not received a shot to protect against hepatitis B (hepatitis B vaccine).  You have HIV or AIDS.  You use needles to inject street drugs.  You live with, or have sex with, someone who has hepatitis B.  You get hemodialysis treatment.  You take certain medicines for conditions like cancer, organ transplantation, and autoimmune conditions.  Hepatitis C blood testing is recommended for all people born from 1945 through 1965 and any individual with known risks for hepatitis C.  Practice safe sex. Use condoms and avoid high-risk sexual practices to reduce the spread of sexually transmitted infections (STIs). STIs include gonorrhea, chlamydia, syphilis, trichomonas, herpes, HPV, and human immunodeficiency virus (HIV). Herpes, HIV, and HPV are viral illnesses that have no cure. They can result in disability, cancer, and death.  You should be screened for sexually transmitted illnesses (STIs) including gonorrhea and chlamydia if:  You are sexually active and are younger than 24 years.  You are older than 24 years and your health care provider tells you that you are at risk for this type of infection.  Your sexual activity has changed since you were last screened and you are at an increased risk for chlamydia or gonorrhea. Ask your health care provider if you are at risk.  If you are at risk of being infected with HIV, it is recommended that you take a prescription medicine daily to prevent HIV infection. This is  called preexposure prophylaxis (PrEP). You are considered at risk if:  You are a heterosexual woman, are sexually active, and are at increased risk for HIV infection.  You take drugs by injection.  You are sexually active with a partner who has HIV.  Talk with your health care provider about whether you are at high risk of being infected with HIV. If you choose to begin PrEP, you should first be tested for HIV. You should then be tested every 3 months for as long as you are taking PrEP.  Osteoporosis is a disease in which the bones lose minerals and strength   with aging. This can result in serious bone fractures or breaks. The risk of osteoporosis can be identified using a bone density scan. Women ages 65 years and over and women at risk for fractures or osteoporosis should discuss screening with their health care providers. Ask your health care provider whether you should take a calcium supplement or vitamin D to reduce the rate of osteoporosis.  Menopause can be associated with physical symptoms and risks. Hormone replacement therapy is available to decrease symptoms and risks. You should talk to your health care provider about whether hormone replacement therapy is right for you.  Use sunscreen. Apply sunscreen liberally and repeatedly throughout the day. You should seek shade when your shadow is shorter than you. Protect yourself by wearing long sleeves, pants, a wide-brimmed hat, and sunglasses year round, whenever you are outdoors.  Once a month, do a whole body skin exam, using a mirror to look at the skin on your back. Tell your health care provider of new moles, moles that have irregular borders, moles that are larger than a pencil eraser, or moles that have changed in shape or color.  Stay current with required vaccines (immunizations).  Influenza vaccine. All adults should be immunized every year.  Tetanus, diphtheria, and acellular pertussis (Td, Tdap) vaccine. Pregnant women should  receive 1 dose of Tdap vaccine during each pregnancy. The dose should be obtained regardless of the length of time since the last dose. Immunization is preferred during the 27th-36th week of gestation. An adult who has not previously received Tdap or who does not know her vaccine status should receive 1 dose of Tdap. This initial dose should be followed by tetanus and diphtheria toxoids (Td) booster doses every 10 years. Adults with an unknown or incomplete history of completing a 3-dose immunization series with Td-containing vaccines should begin or complete a primary immunization series including a Tdap dose. Adults should receive a Td booster every 10 years.  Varicella vaccine. An adult without evidence of immunity to varicella should receive 2 doses or a second dose if she has previously received 1 dose. Pregnant females who do not have evidence of immunity should receive the first dose after pregnancy. This first dose should be obtained before leaving the health care facility. The second dose should be obtained 4-8 weeks after the first dose.  Human papillomavirus (HPV) vaccine. Females aged 13-26 years who have not received the vaccine previously should obtain the 3-dose series. The vaccine is not recommended for use in pregnant females. However, pregnancy testing is not needed before receiving a dose. If a female is found to be pregnant after receiving a dose, no treatment is needed. In that case, the remaining doses should be delayed until after the pregnancy. Immunization is recommended for any person with an immunocompromised condition through the age of 26 years if she did not get any or all doses earlier. During the 3-dose series, the second dose should be obtained 4-8 weeks after the first dose. The third dose should be obtained 24 weeks after the first dose and 16 weeks after the second dose.  Zoster vaccine. One dose is recommended for adults aged 60 years or older unless certain conditions are  present.  Measles, mumps, and rubella (MMR) vaccine. Adults born before 1957 generally are considered immune to measles and mumps. Adults born in 1957 or later should have 1 or more doses of MMR vaccine unless there is a contraindication to the vaccine or there is laboratory evidence of immunity to   each of the three diseases. A routine second dose of MMR vaccine should be obtained at least 28 days after the first dose for students attending postsecondary schools, health care workers, or international travelers. People who received inactivated measles vaccine or an unknown type of measles vaccine during 1963-1967 should receive 2 doses of MMR vaccine. People who received inactivated mumps vaccine or an unknown type of mumps vaccine before 1979 and are at high risk for mumps infection should consider immunization with 2 doses of MMR vaccine. For females of childbearing age, rubella immunity should be determined. If there is no evidence of immunity, females who are not pregnant should be vaccinated. If there is no evidence of immunity, females who are pregnant should delay immunization until after pregnancy. Unvaccinated health care workers born before 1957 who lack laboratory evidence of measles, mumps, or rubella immunity or laboratory confirmation of disease should consider measles and mumps immunization with 2 doses of MMR vaccine or rubella immunization with 1 dose of MMR vaccine.  Pneumococcal 13-valent conjugate (PCV13) vaccine. When indicated, a person who is uncertain of her immunization history and has no record of immunization should receive the PCV13 vaccine. An adult aged 19 years or older who has certain medical conditions and has not been previously immunized should receive 1 dose of PCV13 vaccine. This PCV13 should be followed with a dose of pneumococcal polysaccharide (PPSV23) vaccine. The PPSV23 vaccine dose should be obtained at least 8 weeks after the dose of PCV13 vaccine. An adult aged 19  years or older who has certain medical conditions and previously received 1 or more doses of PPSV23 vaccine should receive 1 dose of PCV13. The PCV13 vaccine dose should be obtained 1 or more years after the last PPSV23 vaccine dose.  Pneumococcal polysaccharide (PPSV23) vaccine. When PCV13 is also indicated, PCV13 should be obtained first. All adults aged 65 years and older should be immunized. An adult younger than age 65 years who has certain medical conditions should be immunized. Any person who resides in a nursing home or long-term care facility should be immunized. An adult smoker should be immunized. People with an immunocompromised condition and certain other conditions should receive both PCV13 and PPSV23 vaccines. People with human immunodeficiency virus (HIV) infection should be immunized as soon as possible after diagnosis. Immunization during chemotherapy or radiation therapy should be avoided. Routine use of PPSV23 vaccine is not recommended for American Indians, [place] Natives, or people younger than 65 years unless there are medical conditions that require PPSV23 vaccine. When indicated, people who have unknown immunization and have no record of immunization should receive PPSV23 vaccine. One-time revaccination 5 years after the first dose of PPSV23 is recommended for people aged 19-64 years who have chronic kidney failure, nephrotic syndrome, asplenia, or immunocompromised conditions. People who received 1-2 doses of PPSV23 before age 65 years should receive another dose of PPSV23 vaccine at age 65 years or later if at least 5 years have passed since the previous dose. Doses of PPSV23 are not needed for people immunized with PPSV23 at or after age 65 years.  Meningococcal vaccine. Adults with asplenia or persistent complement component deficiencies should receive 2 doses of quadrivalent meningococcal conjugate (MenACWY-D) vaccine. The doses should be obtained at least 2 months apart.  Microbiologists working with certain meningococcal bacteria, military recruits, people at risk during an outbreak, and people who travel to or live in countries with a high rate of meningitis should be immunized. A first-year college student up through age   21 years who is living in a residence hall should receive a dose if she did not receive a dose on or after her 16th birthday. Adults who have certain high-risk conditions should receive one or more doses of vaccine.  Hepatitis A vaccine. Adults who wish to be protected from this disease, have certain high-risk conditions, work with hepatitis A-infected animals, work in hepatitis A research labs, or travel to or work in countries with a high rate of hepatitis A should be immunized. Adults who were previously unvaccinated and who anticipate close contact with an international adoptee during the first 60 days after arrival in the [place] from a country with a high rate of hepatitis A should be immunized.  Hepatitis B vaccine. Adults who wish to be protected from this disease, have certain high-risk conditions, may be exposed to blood or other infectious body fluids, are household contacts or sex partners of hepatitis B positive people, are clients or workers in certain care facilities, or travel to or work in countries with a high rate of hepatitis B should be immunized.  Haemophilus influenzae type b (Hib) vaccine. A previously unvaccinated person with asplenia or sickle cell disease or having a scheduled splenectomy should receive 1 dose of Hib vaccine. Regardless of previous immunization, a recipient of a hematopoietic stem cell transplant should receive a 3-dose series 6-12 months after her successful transplant. Hib vaccine is not recommended for adults with HIV infection. Preventive Services / Frequency Ages 64 to 68 years  Blood pressure check.** / Every 1 to 2 years.  Lipid and cholesterol check.** / Every 5 years beginning at age  22.  Clinical breast exam.** / Every 3 years for women in their 88s and 53s.  BRCA-related cancer risk assessment.** / For women who have family members with a BRCA-related cancer (breast, ovarian, tubal, or peritoneal cancers).  Pap test.** / Every 2 years from ages 90 through 51. Every 3 years starting at age 21 through age 56 or 3 with a history of 3 consecutive normal Pap tests.  HPV screening.** / Every 3 years from ages 24 through ages 1 to 46 with a history of 3 consecutive normal Pap tests.  Hepatitis C blood test.** / For any individual with known risks for hepatitis C.  Skin self-exam. / Monthly.  Influenza vaccine. / Every year.  Tetanus, diphtheria, and acellular pertussis (Tdap, Td) vaccine.** / Consult your health care provider. Pregnant women should receive 1 dose of Tdap vaccine during each pregnancy. 1 dose of Td every 10 years.  Varicella vaccine.** / Consult your health care provider. Pregnant females who do not have evidence of immunity should receive the first dose after pregnancy.  HPV vaccine. / 3 doses over 6 months, if 72 and younger. The vaccine is not recommended for use in pregnant females. However, pregnancy testing is not needed before receiving a dose.  Measles, mumps, rubella (MMR) vaccine.** / You need at least 1 dose of MMR if you were born in 1957 or later. You may also need a 2nd dose. For females of childbearing age, rubella immunity should be determined. If there is no evidence of immunity, females who are not pregnant should be vaccinated. If there is no evidence of immunity, females who are pregnant should delay immunization until after pregnancy.  Pneumococcal 13-valent conjugate (PCV13) vaccine.** / Consult your health care provider.  Pneumococcal polysaccharide (PPSV23) vaccine.** / 1 to 2 doses if you smoke cigarettes or if you have certain conditions.  Meningococcal vaccine.** /  1 dose if you are age 19 to 21 years and a first-year college  student living in a residence hall, or have one of several medical conditions, you need to get vaccinated against meningococcal disease. You may also need additional booster doses.  Hepatitis A vaccine.** / Consult your health care provider.  Hepatitis B vaccine.** / Consult your health care provider.  Haemophilus influenzae type b (Hib) vaccine.** / Consult your health care provider. Ages 40 to 64 years  Blood pressure check.** / Every 1 to 2 years.  Lipid and cholesterol check.** / Every 5 years beginning at age 20 years.  Lung cancer screening. / Every year if you are aged 55-80 years and have a 30-pack-year history of smoking and currently smoke or have quit within the past 15 years. Yearly screening is stopped once you have quit smoking for at least 15 years or develop a health problem that would prevent you from having lung cancer treatment.  Clinical breast exam.** / Every year after age 40 years.  BRCA-related cancer risk assessment.** / For women who have family members with a BRCA-related cancer (breast, ovarian, tubal, or peritoneal cancers).  Mammogram.** / Every year beginning at age 40 years and continuing for as long as you are in good health. Consult with your health care provider.  Pap test.** / Every 3 years starting at age 30 years through age 65 or 70 years with a history of 3 consecutive normal Pap tests.  HPV screening.** / Every 3 years from ages 30 years through ages 65 to 70 years with a history of 3 consecutive normal Pap tests.  Fecal occult blood test (FOBT) of stool. / Every year beginning at age 50 years and continuing until age 75 years. You may not need to do this test if you get a colonoscopy every 10 years.  Flexible sigmoidoscopy or colonoscopy.** / Every 5 years for a flexible sigmoidoscopy or every 10 years for a colonoscopy beginning at age 50 years and continuing until age 75 years.  Hepatitis C blood test.** / For all people born from 1945 through  1965 and any individual with known risks for hepatitis C.  Skin self-exam. / Monthly.  Influenza vaccine. / Every year.  Tetanus, diphtheria, and acellular pertussis (Tdap/Td) vaccine.** / Consult your health care provider. Pregnant women should receive 1 dose of Tdap vaccine during each pregnancy. 1 dose of Td every 10 years.  Varicella vaccine.** / Consult your health care provider. Pregnant females who do not have evidence of immunity should receive the first dose after pregnancy.  Zoster vaccine.** / 1 dose for adults aged 60 years or older.  Measles, mumps, rubella (MMR) vaccine.** / You need at least 1 dose of MMR if you were born in 1957 or later. You may also need a 2nd dose. For females of childbearing age, rubella immunity should be determined. If there is no evidence of immunity, females who are not pregnant should be vaccinated. If there is no evidence of immunity, females who are pregnant should delay immunization until after pregnancy.  Pneumococcal 13-valent conjugate (PCV13) vaccine.** / Consult your health care provider.  Pneumococcal polysaccharide (PPSV23) vaccine.** / 1 to 2 doses if you smoke cigarettes or if you have certain conditions.  Meningococcal vaccine.** / Consult your health care provider.  Hepatitis A vaccine.** / Consult your health care provider.  Hepatitis B vaccine.** / Consult your health care provider.  Haemophilus influenzae type b (Hib) vaccine.** / Consult your health care provider. Ages 65   years and over  Blood pressure check.** / Every 1 to 2 years.  Lipid and cholesterol check.** / Every 5 years beginning at age 22 years.  Lung cancer screening. / Every year if you are aged 73-80 years and have a 30-pack-year history of smoking and currently smoke or have quit within the past 15 years. Yearly screening is stopped once you have quit smoking for at least 15 years or develop a health problem that would prevent you from having lung cancer  treatment.  Clinical breast exam.** / Every year after age 4 years.  BRCA-related cancer risk assessment.** / For women who have family members with a BRCA-related cancer (breast, ovarian, tubal, or peritoneal cancers).  Mammogram.** / Every year beginning at age 40 years and continuing for as long as you are in good health. Consult with your health care provider.  Pap test.** / Every 3 years starting at age 9 years through age 34 or 91 years with 3 consecutive normal Pap tests. Testing can be stopped between 65 and 70 years with 3 consecutive normal Pap tests and no abnormal Pap or HPV tests in the past 10 years.  HPV screening.** / Every 3 years from ages 57 years through ages 64 or 45 years with a history of 3 consecutive normal Pap tests. Testing can be stopped between 65 and 70 years with 3 consecutive normal Pap tests and no abnormal Pap or HPV tests in the past 10 years.  Fecal occult blood test (FOBT) of stool. / Every year beginning at age 15 years and continuing until age 17 years. You may not need to do this test if you get a colonoscopy every 10 years.  Flexible sigmoidoscopy or colonoscopy.** / Every 5 years for a flexible sigmoidoscopy or every 10 years for a colonoscopy beginning at age 86 years and continuing until age 71 years.  Hepatitis C blood test.** / For all people born from [birth year] through 1963 and any individual with known risks for hepatitis C.  Osteoporosis screening.** / A one-time screening for women ages 83 years and over and women at risk for fractures or osteoporosis.  Skin self-exam. / Monthly.  Influenza vaccine. / Every year.  Tetanus, diphtheria, and acellular pertussis (Tdap/Td) vaccine.** / 1 dose of Td every 10 years.  Varicella vaccine.** / Consult your health care provider.  Zoster vaccine.** / 1 dose for adults aged 61 years or older.  Pneumococcal 13-valent conjugate (PCV13) vaccine.** / Consult your health care provider.  Pneumococcal  polysaccharide (PPSV23) vaccine.** / 1 dose for all adults aged 28 years and older.  Meningococcal vaccine.** / Consult your health care provider.  Hepatitis A vaccine.** / Consult your health care provider.  Hepatitis B vaccine.** / Consult your health care provider.  Haemophilus influenzae type b (Hib) vaccine.** / Consult your health care provider. ** Family history and personal history of risk and conditions may change your health care provider's recommendations. Document Released: 02/25/2001 Document Revised: 05/16/2013 Document Reviewed: 05/27/2010 [facility] Patient Information 2015 [place], [place]. This information is not intended to replace advice given to you by your health care provider. Make sure you discuss any questions you have with your health care provider.

## 2013-11-23 ENCOUNTER — Other Ambulatory Visit (INDEPENDENT_AMBULATORY_CARE_PROVIDER_SITE_OTHER): Payer: BC Managed Care – PPO

## 2013-11-23 DIAGNOSIS — Z0000 Encounter for general adult medical examination without abnormal findings: Secondary | ICD-10-CM

## 2013-11-23 DIAGNOSIS — E038 Other specified hypothyroidism: Secondary | ICD-10-CM

## 2013-11-23 DIAGNOSIS — R319 Hematuria, unspecified: Secondary | ICD-10-CM

## 2013-11-23 DIAGNOSIS — R8290 Unspecified abnormal findings in urine: Secondary | ICD-10-CM

## 2013-11-23 DIAGNOSIS — I10 Essential (primary) hypertension: Secondary | ICD-10-CM

## 2013-11-23 LAB — CBC WITH DIFFERENTIAL/PLATELET
BASOS PCT: 0.6 % (ref 0.0–3.0)
Basophils Absolute: 0 10*3/uL (ref 0.0–0.1)
EOS PCT: 3.6 % (ref 0.0–5.0)
Eosinophils Absolute: 0.2 10*3/uL (ref 0.0–0.7)
HEMATOCRIT: 36.3 % (ref 36.0–46.0)
HEMOGLOBIN: 12 g/dL (ref 12.0–15.0)
LYMPHS ABS: 1.7 10*3/uL (ref 0.7–4.0)
Lymphocytes Relative: 39.6 % (ref 12.0–46.0)
MCHC: 33.1 g/dL (ref 30.0–36.0)
MCV: 86.5 fl (ref 78.0–100.0)
MONOS PCT: 9.4 % (ref 3.0–12.0)
Monocytes Absolute: 0.4 10*3/uL (ref 0.1–1.0)
NEUTROS ABS: 2 10*3/uL (ref 1.4–7.7)
Neutrophils Relative %: 46.8 % (ref 43.0–77.0)
Platelets: 196 10*3/uL (ref 150.0–400.0)
RBC: 4.2 Mil/uL (ref 3.87–5.11)
RDW: 13.8 % (ref 11.5–15.5)
WBC: 4.2 10*3/uL (ref 4.0–10.5)

## 2013-11-23 LAB — HEPATIC FUNCTION PANEL
ALT: 21 U/L (ref 0–35)
AST: 21 U/L (ref 0–37)
Albumin: 3.3 g/dL — ABNORMAL LOW (ref 3.5–5.2)
Alkaline Phosphatase: 54 U/L (ref 39–117)
Bilirubin, Direct: 0.1 mg/dL (ref 0.0–0.3)
Total Bilirubin: 1 mg/dL (ref 0.2–1.2)
Total Protein: 6.8 g/dL (ref 6.0–8.3)

## 2013-11-23 LAB — BASIC METABOLIC PANEL
BUN: 14 mg/dL (ref 6–23)
CO2: 26 mEq/L (ref 19–32)
Calcium: 9.6 mg/dL (ref 8.4–10.5)
Chloride: 104 mEq/L (ref 96–112)
Creatinine, Ser: 1.3 mg/dL — ABNORMAL HIGH (ref 0.4–1.2)
GFR: 54 mL/min — AB (ref >60.00)
Glucose, Bld: 97 mg/dL (ref 70–99)
POTASSIUM: 4.4 meq/L (ref 3.5–5.1)
SODIUM: 141 meq/L (ref 135–145)

## 2013-11-23 LAB — LIPID PANEL
CHOLESTEROL: 162 mg/dL (ref 0–200)
HDL: 35.1 mg/dL — ABNORMAL LOW (ref >39.00)
LDL CALC: 112 mg/dL — AB (ref 0–99)
NonHDL: 126.9
Total CHOL/HDL Ratio: 5
Triglycerides: 75 mg/dL (ref 0.0–149.0)
VLDL: 15 mg/dL (ref 0.0–40.0)

## 2013-11-23 LAB — TSH: TSH: 1.48 u[IU]/mL (ref 0.35–4.50)

## 2013-11-24 ENCOUNTER — Other Ambulatory Visit: Payer: BC Managed Care – PPO

## 2013-11-24 ENCOUNTER — Telehealth: Payer: Self-pay | Admitting: Family Medicine

## 2013-11-24 LAB — CYTOLOGY - PAP

## 2013-11-24 NOTE — Telephone Encounter (Signed)
Cr elevated-- ( kidney function) Cholesterol--- LDL goal < 100, HDL >40, TG < 150. Diet and exercise will increase HDL and decrease LDL and TG. Fish, Fish Oil, Flaxseed oil will also help increase the HDL and decrease Triglycerides.  Recheck labs in 47months If the creatinine con't to inc we will refer you to a nephrologist. Lipid, hep, bmp .  PATIENT HAS BEEN MADE AWARE AND VOICED UNDERSTANDING. SHE WILL COME IN TOMORROW FOR HER POCT UA     KP

## 2013-11-24 NOTE — Telephone Encounter (Signed)
Caller name: Sheetal Relation to pt: self Call back number: [phone] Pharmacy:  Reason for call:   Patient wants to know why she did not have to give a urine culture at last visit

## 2013-11-25 LAB — POCT URINALYSIS DIPSTICK
Bilirubin, UA: NEGATIVE
Glucose, UA: NEGATIVE
Ketones, UA: NEGATIVE
Leukocytes, UA: NEGATIVE
NITRITE UA: NEGATIVE
PH UA: 7
Spec Grav, UA: 1.015
Urobilinogen, UA: 0.2

## 2013-11-26 LAB — URINE CULTURE
Colony Count: NO GROWTH
Organism ID, Bacteria: NO GROWTH

## 2013-12-13 ENCOUNTER — Telehealth: Payer: Self-pay | Admitting: Family Medicine

## 2013-12-13 MED ORDER — LEVOTHYROXINE SODIUM 50 MCG PO TABS: ORAL_TABLET | ORAL | Status: DC

## 2013-12-13 MED ORDER — BISOPROLOL-HYDROCHLOROTHIAZIDE 2.5-6.25 MG PO TABS: ORAL_TABLET | ORAL | Status: DC

## 2013-12-13 NOTE — Telephone Encounter (Signed)
Caller name:Krupp Lennis Relation to EU:MPNT Call back number:[phone] Pharmacy:[email or web address]  Reason for call: pt states [facility] has sent two request but have not gotten a response, pt needs rx levothyroxine (SYNTHROID, LEVOTHROID) 50 MCG tablet, pt also would like to get all of her meds sent over states she has had her CPE.

## 2013-12-13 NOTE — Telephone Encounter (Signed)
Rx sent to [facility].     KP

## 2013-12-30 ENCOUNTER — Encounter: Payer: Self-pay | Admitting: Family Medicine

## 2014-05-23 ENCOUNTER — Ambulatory Visit: Payer: BC Managed Care – PPO | Admitting: Family Medicine

## 2014-06-26 ENCOUNTER — Encounter: Payer: Self-pay | Admitting: Family Medicine

## 2014-06-26 ENCOUNTER — Ambulatory Visit (INDEPENDENT_AMBULATORY_CARE_PROVIDER_SITE_OTHER): Payer: BLUE CROSS/BLUE SHIELD | Admitting: Family Medicine

## 2014-06-26 VITALS — BP 116/70 | HR 43 | Temp 97.8°F | Ht 68.0 in | Wt 166.8 lb

## 2014-06-26 DIAGNOSIS — I10 Essential (primary) hypertension: Secondary | ICD-10-CM | POA: Diagnosis not present

## 2014-06-26 DIAGNOSIS — E039 Hypothyroidism, unspecified: Secondary | ICD-10-CM

## 2014-06-26 DIAGNOSIS — E785 Hyperlipidemia, unspecified: Secondary | ICD-10-CM

## 2014-06-26 LAB — BASIC METABOLIC PANEL
BUN: 15 mg/dL (ref 6–23)
CALCIUM: 9.8 mg/dL (ref 8.4–10.5)
CHLORIDE: 101 meq/L (ref 96–112)
CO2: 31 mEq/L (ref 19–32)
Creatinine, Ser: 1.53 mg/dL — ABNORMAL HIGH (ref 0.40–1.20)
GFR: 44.26 mL/min — ABNORMAL LOW (ref >60.00)
Glucose, Bld: 94 mg/dL (ref 70–99)
Potassium: 3.9 mEq/L (ref 3.5–5.1)
SODIUM: 138 meq/L (ref 135–145)

## 2014-06-26 LAB — HEPATIC FUNCTION PANEL
ALT: 17 U/L (ref 0–35)
AST: 20 U/L (ref 0–37)
Albumin: 4.1 g/dL (ref 3.5–5.2)
Alkaline Phosphatase: 65 U/L (ref 39–117)
Bilirubin, Direct: 0.1 mg/dL (ref 0.0–0.3)
TOTAL PROTEIN: 7.1 g/dL (ref 6.0–8.3)
Total Bilirubin: 0.8 mg/dL (ref 0.2–1.2)

## 2014-06-26 LAB — LIPID PANEL
CHOLESTEROL: 151 mg/dL (ref 0–200)
HDL: 41.2 mg/dL (ref >39.00)
LDL CALC: 95 mg/dL (ref 0–99)
NONHDL: 109.8
Total CHOL/HDL Ratio: 4
Triglycerides: 74 mg/dL (ref 0.0–149.0)
VLDL: 14.8 mg/dL (ref 0.0–40.0)

## 2014-06-26 LAB — TSH: TSH: 1.19 u[IU]/mL (ref 0.35–4.50)

## 2014-06-26 NOTE — Patient Instructions (Signed)

## 2014-06-26 NOTE — Assessment & Plan Note (Signed)
bp low still---  D/c med and recheck 2 weeks or sooner if there is a problem Pt is getting ready to retire early

## 2014-06-26 NOTE — Progress Notes (Signed)
Pre visit review using our clinic review tool, if applicable. No additional management support is needed unless otherwise documented below in the visit note. 

## 2014-06-26 NOTE — Assessment & Plan Note (Signed)
con't synthroid Check TSH  

## 2014-06-26 NOTE — Progress Notes (Signed)
Patient ID: Lydia Joyce, female    DOB: 22-Feb-1950  Age: 64 y.o. MRN: [id number]    Subjective:  Subjective HPI Lydia Joyce presents for f/u bp.    Review of Systems  Constitutional: Negative for diaphoresis, appetite change, fatigue and unexpected weight change.  Eyes: Negative for pain, redness and visual disturbance.  Respiratory: Negative for cough, chest tightness, shortness of breath and wheezing.   Cardiovascular: Negative for chest pain, palpitations and leg swelling.  Endocrine: Negative for cold intolerance, heat intolerance, polydipsia, polyphagia and polyuria.  Genitourinary: Negative for dysuria, frequency and difficulty urinating.  Neurological: Negative for dizziness, light-headedness, numbness and headaches.    History Past Medical History  Diagnosis Date  . Hypertension   . Thyroid disease   . Arthritis   . Chicken pox     She has past surgical history that includes Hernia repair.   Her family history includes Arthritis in her father and mother; Hypertension in her father; Stroke in her father and mother; Thyroid disease in her sister.She reports that she has never smoked. She has never used smokeless tobacco. She reports that she does not drink alcohol or use illicit drugs.  Current Outpatient Prescriptions on File Prior to Visit  Medication Sig Dispense Refill  . Aspirin Buf,AlHyd-MgHyd-CaCar, (ASCRIPTIN) 325 MG TABS 1 po qd  0  . Cholecalciferol (VITAMIN D3) 5000 UNITS CAPS Take by mouth.    . Ferrous Sulfate (IRON) 325 (65 FE) MG TABS Take 1 tablet by mouth daily.    Marland Kitchen levothyroxine (SYNTHROID, LEVOTHROID) 50 MCG tablet 1 tab by mouth daily 90 tablet 3  . Misc Natural Products (GLUCOSAMINE CHOND COMPLEX/MSM) TABS Take 1 tablet by mouth daily.    . Multiple Vitamin (MULTIVITAMIN) tablet Take 1 tablet by mouth daily.     No current facility-administered medications on file prior to visit.     Objective:  Objective Physical Exam  Constitutional: She  is oriented to person, place, and time. She appears well-developed and well-nourished. No distress.  HENT:  Head: Normocephalic and atraumatic.  Eyes: Conjunctivae and EOM are normal. Pupils are equal, round, and reactive to light.  Neck: Normal range of motion. Neck supple. No thyromegaly present.  Cardiovascular: Normal rate, regular rhythm, normal heart sounds and intact distal pulses.   No murmur heard. Pulmonary/Chest: Effort normal and breath sounds normal. No respiratory distress.  Abdominal: Soft. She exhibits no distension. There is no tenderness.  Musculoskeletal: She exhibits no edema.  Lymphadenopathy:    She has no cervical adenopathy.  Neurological: She is alert and oriented to person, place, and time.  Skin: Skin is warm and dry.  Psychiatric: She has a normal mood and affect. Her behavior is normal.   BP 116/70 mmHg  Pulse 43  Temp(Src) 97.8 F (36.6 C) (Oral)  Ht 5\' 8"  (1.727 m)  Wt 166 lb 12.8 oz (75.66 kg)  BMI 25.37 kg/m2  SpO2 97% Wt Readings from Last 3 Encounters:  06/26/14 166 lb 12.8 oz (75.66 kg)  11/21/13 171 lb 9.6 oz (77.837 kg)  11/22/12 166 lb (75.297 kg)     Lab Results  Component Value Date   WBC 4.2 11/23/2013   HGB 12.0 11/23/2013   HCT 36.3 11/23/2013   PLT 196.0 11/23/2013   GLUCOSE 97 11/23/2013   CHOL 162 11/23/2013   TRIG 75.0 11/23/2013   HDL 35.10* 11/23/2013   LDLCALC 112* 11/23/2013   ALT 21 11/23/2013   AST 21 11/23/2013   NA 141 11/23/2013  K 4.4 11/23/2013   CL 104 11/23/2013   CREATININE 1.3* 11/23/2013   BUN 14 11/23/2013   CO2 26 11/23/2013   TSH 1.48 11/23/2013    No results found.   Assessment & Plan:  Plan I have discontinued Ms. Show's bisoprolol-hydrochlorothiazide. I am also having her maintain her multivitamin, GLUCOSAMINE CHOND COMPLEX/MSM, Vitamin D3, Iron, ASCRIPTIN, and levothyroxine.  No orders of the defined types were placed in this encounter.    Problem List Items Addressed This Visit     Hypothyroidism    con't synthroid Check TSH      Relevant Orders   TSH   HTN (hypertension) - Primary    bp low still---  D/c med and recheck 2 weeks or sooner if there is a problem Pt is getting ready to retire early      Relevant Orders   Basic metabolic panel    Other Visit Diagnoses    Hyperlipidemia        Relevant Orders    Hepatic function panel    Lipid panel       Follow-up: Return in about 2 weeks (around 07/10/2014), or if symptoms worsen or fail to improve, for bp check.  Garnet Koyanagi, DO

## 2014-06-29 ENCOUNTER — Telehealth: Payer: Self-pay | Admitting: Family Medicine

## 2014-06-29 ENCOUNTER — Telehealth: Payer: Self-pay | Admitting: *Deleted

## 2014-06-29 DIAGNOSIS — R7989 Other specified abnormal findings of blood chemistry: Secondary | ICD-10-CM

## 2014-06-29 NOTE — Telephone Encounter (Signed)
Its possible--- when was last dose--- repeat bmp

## 2014-06-29 NOTE — Telephone Encounter (Signed)
Msg left to call the office     KP 

## 2014-06-29 NOTE — Telephone Encounter (Signed)
Caller name:ETHYL  Relationship to patient:SELF Can be reached:[phone] Pharmacy:  Reason for caLL: SHE TOOK METAPRIM FOR MALARIA FOR ABOUT A MONTH AND THAT IS WHAT MAY HAVE CAUSED HER LABS RESULTS

## 2014-06-29 NOTE — Telephone Encounter (Signed)
Elevated CR. Please advise if this medication could be the cause.      KP

## 2014-06-29 NOTE — Telephone Encounter (Signed)
-----   Message from Rosalita Chessman, DO sent at 06/28/2014  6:24 PM EDT ----- Cr slowly increasing---- she needs a referral to nephrology

## 2014-06-29 NOTE — Telephone Encounter (Signed)
Notified pt. She did agree to referral. Advised her to avoid NSAIDs. States she takes aspirin 2-3 x a week and advised her to stop that as well. Referral placed.

## 2014-07-11 ENCOUNTER — Encounter: Payer: Self-pay | Admitting: Family Medicine

## 2014-07-11 ENCOUNTER — Ambulatory Visit (INDEPENDENT_AMBULATORY_CARE_PROVIDER_SITE_OTHER): Payer: BLUE CROSS/BLUE SHIELD | Admitting: Family Medicine

## 2014-07-11 VITALS — BP 150/82 | HR 68 | Temp 97.9°F | Resp 18 | Ht 68.0 in | Wt 167.2 lb

## 2014-07-11 DIAGNOSIS — R748 Abnormal levels of other serum enzymes: Secondary | ICD-10-CM

## 2014-07-11 DIAGNOSIS — E559 Vitamin D deficiency, unspecified: Secondary | ICD-10-CM

## 2014-07-11 DIAGNOSIS — I10 Essential (primary) hypertension: Secondary | ICD-10-CM

## 2014-07-11 DIAGNOSIS — Z862 Personal history of diseases of the blood and blood-forming organs and certain disorders involving the immune mechanism: Secondary | ICD-10-CM | POA: Diagnosis not present

## 2014-07-11 DIAGNOSIS — R7989 Other specified abnormal findings of blood chemistry: Secondary | ICD-10-CM

## 2014-07-11 LAB — CBC WITH DIFFERENTIAL/PLATELET
BASOS ABS: 0 10*3/uL (ref 0.0–0.1)
Basophils Relative: 0.5 % (ref 0.0–3.0)
Eosinophils Absolute: 0.1 10*3/uL (ref 0.0–0.7)
Eosinophils Relative: 2.9 % (ref 0.0–5.0)
HCT: 37.4 % (ref 36.0–46.0)
HEMOGLOBIN: 12.5 g/dL (ref 12.0–15.0)
Lymphocytes Relative: 35.7 % (ref 12.0–46.0)
Lymphs Abs: 1.6 10*3/uL (ref 0.7–4.0)
MCHC: 33.5 g/dL (ref 30.0–36.0)
MCV: 87.2 fl (ref 78.0–100.0)
MONOS PCT: 9.9 % (ref 3.0–12.0)
Monocytes Absolute: 0.4 10*3/uL (ref 0.1–1.0)
Neutro Abs: 2.3 10*3/uL (ref 1.4–7.7)
Neutrophils Relative %: 51 % (ref 43.0–77.0)
Platelets: 216 10*3/uL (ref 150.0–400.0)
RBC: 4.28 Mil/uL (ref 3.87–5.11)
RDW: 14.4 % (ref 11.5–15.5)
WBC: 4.5 10*3/uL (ref 4.0–10.5)

## 2014-07-11 LAB — BASIC METABOLIC PANEL
BUN: 13 mg/dL (ref 6–23)
CALCIUM: 9.9 mg/dL (ref 8.4–10.5)
CHLORIDE: 103 meq/L (ref 96–112)
CO2: 31 meq/L (ref 19–32)
Creatinine, Ser: 1.26 mg/dL — ABNORMAL HIGH (ref 0.40–1.20)
GFR: 55.37 mL/min — ABNORMAL LOW (ref >60.00)
Glucose, Bld: 82 mg/dL (ref 70–99)
Potassium: 3.9 mEq/L (ref 3.5–5.1)
Sodium: 139 mEq/L (ref 135–145)

## 2014-07-11 MED ORDER — HYDROCHLOROTHIAZIDE 25 MG PO TABS: 25.0000 mg | ORAL_TABLET | Freq: Every day | ORAL | Status: DC

## 2014-07-11 NOTE — Progress Notes (Signed)
Patient ID: Lydia Joyce, female    DOB: 1950/06/21  Age: 64 y.o. MRN: [id number]    Subjective:  Subjective HPI Lydia Joyce presents for f/u cr and bp.  bp has been up and down.  No other complaints.    Review of Systems  Constitutional: Negative for activity change, appetite change, fatigue and unexpected weight change.  Respiratory: Negative for cough and shortness of breath.   Cardiovascular: Negative for chest pain and palpitations.  Psychiatric/Behavioral: Negative for behavioral problems and dysphoric mood. The patient is not nervous/anxious.     History Past Medical History  Diagnosis Date  . Hypertension   . Thyroid disease   . Arthritis   . Chicken pox     She has past surgical history that includes Hernia repair.   Her family history includes Arthritis in her father and mother; Hypertension in her father; Stroke in her father and mother; Thyroid disease in her sister.She reports that she has never smoked. She has never used smokeless tobacco. She reports that she does not drink alcohol or use illicit drugs.  Current Outpatient Prescriptions on File Prior to Visit  Medication Sig Dispense Refill  . Aspirin Buf,AlHyd-MgHyd-CaCar, (ASCRIPTIN) 325 MG TABS 1 po qd  0  . Cholecalciferol (VITAMIN D3) 5000 UNITS CAPS Take by mouth.    . Ferrous Sulfate (IRON) 325 (65 FE) MG TABS Take 1 tablet by mouth daily.    Marland Kitchen levothyroxine (SYNTHROID, LEVOTHROID) 50 MCG tablet 1 tab by mouth daily 90 tablet 3  . Misc Natural Products (GLUCOSAMINE CHOND COMPLEX/MSM) TABS Take 1 tablet by mouth daily.    . Multiple Vitamin (MULTIVITAMIN) tablet Take 1 tablet by mouth daily.     No current facility-administered medications on file prior to visit.     Objective:  Objective Physical Exam  Constitutional: She is oriented to person, place, and time. She appears well-developed and well-nourished.  HENT:  Head: Normocephalic and atraumatic.  Eyes: Conjunctivae and EOM are normal.    Neck: Normal range of motion. Neck supple. No JVD present. Carotid bruit is not present. No thyromegaly present.  Cardiovascular: Normal rate, regular rhythm and normal heart sounds.   No murmur heard. Pulmonary/Chest: Effort normal and breath sounds normal. No respiratory distress. She has no wheezes. She has no rales. She exhibits no tenderness.  Musculoskeletal: She exhibits no edema.  Neurological: She is alert and oriented to person, place, and time.  Psychiatric: She has a normal mood and affect. Her behavior is normal.   BP 150/80 mmHg  Pulse 68  Temp(Src) 97.9 F (36.6 C) (Oral)  Resp 18  Ht 5\' 8"  (1.727 m)  Wt 167 lb 3.2 oz (75.841 kg)  BMI 25.43 kg/m2  SpO2 98% Wt Readings from Last 3 Encounters:  07/11/14 167 lb 3.2 oz (75.841 kg)  06/26/14 166 lb 12.8 oz (75.66 kg)  11/21/13 171 lb 9.6 oz (77.837 kg)     Lab Results  Component Value Date   WBC 4.2 11/23/2013   HGB 12.0 11/23/2013   HCT 36.3 11/23/2013   PLT 196.0 11/23/2013   GLUCOSE 94 06/26/2014   CHOL 151 06/26/2014   TRIG 74.0 06/26/2014   HDL 41.20 06/26/2014   LDLCALC 95 06/26/2014   ALT 17 06/26/2014   AST 20 06/26/2014   NA 138 06/26/2014   K 3.9 06/26/2014   CL 101 06/26/2014   CREATININE 1.53* 06/26/2014   BUN 15 06/26/2014   CO2 31 06/26/2014   TSH 1.19 06/26/2014  No results found.   Assessment & Plan:  Plan I am having Ms. Bruneau start on hydrochlorothiazide. I am also having her maintain her multivitamin, GLUCOSAMINE CHOND COMPLEX/MSM, Vitamin D3, Iron, ASCRIPTIN, and levothyroxine.  Meds ordered this encounter  Medications  . hydrochlorothiazide (HYDRODIURIL) 25 MG tablet    Sig: Take 1 tablet (25 mg total) by mouth daily.    Dispense:  30 tablet    Refill:  11    Problem List Items Addressed This Visit    None    Visit Diagnoses    Essential hypertension    -  Primary    Relevant Medications    hydrochlorothiazide (HYDRODIURIL) 25 MG tablet    Other Relevant Orders     Basic metabolic panel    Elevated serum creatinine        Relevant Orders    Basic metabolic panel    Vitamin D deficiency        Relevant Orders    Vitamin D 1,25 dihydroxy    Hx of iron deficiency anemia        Relevant Orders    CBC with Differential/Platelet       Follow-up: Return in about 3 months (around 10/11/2014), or if symptoms worsen or fail to improve, for hypertension.  Garnet Koyanagi, DO

## 2014-07-11 NOTE — Patient Instructions (Signed)

## 2014-07-11 NOTE — Progress Notes (Signed)
Pre visit review using our clinic review tool, if applicable. No additional management support is needed unless otherwise documented below in the visit note. 

## 2014-07-14 LAB — VITAMIN D 1,25 DIHYDROXY
Vitamin D 1, 25 (OH)2 Total: 36 pg/mL (ref 18–72)
Vitamin D3 1, 25 (OH)2: 36 pg/mL

## 2014-10-18 ENCOUNTER — Telehealth: Payer: Self-pay | Admitting: Family Medicine

## 2014-10-18 MED ORDER — LEVOTHYROXINE SODIUM 50 MCG PO TABS: ORAL_TABLET | ORAL | Status: DC

## 2014-10-18 NOTE — Telephone Encounter (Signed)
Refill sent, notified pt. 

## 2014-10-18 NOTE — Telephone Encounter (Signed)
Relation to ER:DEYC Call back number: [phone] Pharmacy:  [facility] [address], [place]. [phone] (Phone) [phone] (Fax)        Reason for call:  Patient requesting a refill levothyroxine (SYNTHROID, LEVOTHROID) 50 MCG tablet. Patient has a follow up for 12/04/2014

## 2014-10-20 ENCOUNTER — Ambulatory Visit: Payer: BLUE CROSS/BLUE SHIELD | Admitting: Family Medicine

## 2014-12-04 ENCOUNTER — Ambulatory Visit: Payer: BLUE CROSS/BLUE SHIELD | Admitting: Family Medicine

## 2014-12-26 LAB — HM MAMMOGRAPHY

## 2014-12-29 ENCOUNTER — Ambulatory Visit: Payer: BLUE CROSS/BLUE SHIELD | Admitting: Family Medicine

## 2015-03-08 ENCOUNTER — Other Ambulatory Visit: Payer: Self-pay | Admitting: Family Medicine

## 2015-03-26 ENCOUNTER — Telehealth: Payer: Self-pay | Admitting: Behavioral Health

## 2015-03-26 ENCOUNTER — Encounter: Payer: Self-pay | Admitting: Behavioral Health

## 2015-03-26 NOTE — Addendum Note (Signed)
Addended by: Eduard Roux E on: 03/26/2015 12:24 PM   Modules accepted: Medications

## 2015-03-26 NOTE — Telephone Encounter (Signed)
Pre-Visit Call completed with patient and chart updated.   Pre-Visit Info documented in Specialty Comments under [employer].    

## 2015-03-26 NOTE — Telephone Encounter (Signed)
Unable to reach patient at time of Pre-Visit Call.  Left message for patient to return call when available.    

## 2015-03-27 ENCOUNTER — Other Ambulatory Visit (HOSPITAL_COMMUNITY)
Admission: RE | Admit: 2015-03-27 | Discharge: 2015-03-27 | Disposition: A | Discharge status: Home / Self Care | Payer: BLUE CROSS/BLUE SHIELD | Source: Ambulatory Visit | Attending: Family Medicine | Admitting: Family Medicine

## 2015-03-27 ENCOUNTER — Encounter: Payer: Self-pay | Admitting: Family Medicine

## 2015-03-27 ENCOUNTER — Ambulatory Visit (INDEPENDENT_AMBULATORY_CARE_PROVIDER_SITE_OTHER): Payer: BLUE CROSS/BLUE SHIELD | Admitting: Family Medicine

## 2015-03-27 VITALS — BP 132/82 | HR 69 | Temp 97.6°F | Ht 68.0 in | Wt 168.0 lb

## 2015-03-27 DIAGNOSIS — E785 Hyperlipidemia, unspecified: Secondary | ICD-10-CM

## 2015-03-27 DIAGNOSIS — Z114 Encounter for screening for human immunodeficiency virus [HIV]: Secondary | ICD-10-CM | POA: Diagnosis not present

## 2015-03-27 DIAGNOSIS — I10 Essential (primary) hypertension: Secondary | ICD-10-CM

## 2015-03-27 DIAGNOSIS — Z01419 Encounter for gynecological examination (general) (routine) without abnormal findings: Secondary | ICD-10-CM | POA: Diagnosis present

## 2015-03-27 DIAGNOSIS — Z1151 Encounter for screening for human papillomavirus (HPV): Secondary | ICD-10-CM | POA: Insufficient documentation

## 2015-03-27 DIAGNOSIS — Z1159 Encounter for screening for other viral diseases: Secondary | ICD-10-CM

## 2015-03-27 DIAGNOSIS — M8580 Other specified disorders of bone density and structure, unspecified site: Secondary | ICD-10-CM

## 2015-03-27 DIAGNOSIS — Z124 Encounter for screening for malignant neoplasm of cervix: Secondary | ICD-10-CM

## 2015-03-27 DIAGNOSIS — E039 Hypothyroidism, unspecified: Secondary | ICD-10-CM

## 2015-03-27 DIAGNOSIS — Z0000 Encounter for general adult medical examination without abnormal findings: Secondary | ICD-10-CM | POA: Diagnosis not present

## 2015-03-27 DIAGNOSIS — D509 Iron deficiency anemia, unspecified: Secondary | ICD-10-CM

## 2015-03-27 LAB — POCT URINALYSIS DIPSTICK
Bilirubin, UA: NEGATIVE
Glucose, UA: NEGATIVE
Ketones, UA: NEGATIVE
Leukocytes, UA: NEGATIVE
Nitrite, UA: NEGATIVE
PH UA: 6.5
PROTEIN UA: NEGATIVE
RBC UA: NEGATIVE
SPEC GRAV UA: 1.015
UROBILINOGEN UA: 0.2

## 2015-03-27 MED ORDER — LEVOTHYROXINE SODIUM 50 MCG PO TABS: 50.0000 ug | ORAL_TABLET | Freq: Every day | ORAL | Status: DC

## 2015-03-27 MED ORDER — HYDROCHLOROTHIAZIDE 25 MG PO TABS: 25.0000 mg | ORAL_TABLET | Freq: Every day | ORAL | Status: DC

## 2015-03-27 NOTE — Addendum Note (Signed)
Addended by: Ewing Schlein on: 03/27/2015 05:04 PM   Modules accepted: Orders

## 2015-03-27 NOTE — Patient Instructions (Signed)
Preventive Care for Adults, Female A healthy lifestyle and preventive care can promote health and wellness. Preventive health guidelines for women include the following key practices.  A routine yearly physical is a good way to check with your health care provider about your health and preventive screening. It is a chance to share any concerns and updates on your health and to receive a thorough exam.  Visit your dentist for a routine exam and preventive care every 6 months. Brush your teeth twice a day and floss once a day. Good oral hygiene prevents tooth decay and gum disease.  The frequency of eye exams is based on your age, health, family medical history, use of contact lenses, and other factors. Follow your health care provider's recommendations for frequency of eye exams.  Eat a healthy diet. Foods like vegetables, fruits, whole grains, low-fat dairy products, and lean protein foods contain the nutrients you need without too many calories. Decrease your intake of foods high in solid fats, added sugars, and salt. Eat the right amount of calories for you.Get information about a proper diet from your health care provider, if necessary.  Regular physical exercise is one of the most important things you can do for your health. Most adults should get at least 150 minutes of moderate-intensity exercise (any activity that increases your heart rate and causes you to sweat) each week. In addition, most adults need muscle-strengthening exercises on 2 or more days a week.  Maintain a healthy weight. The body mass index (BMI) is a screening tool to identify possible weight problems. It provides an estimate of body fat based on height and weight. Your health care provider can find your BMI and can help you achieve or maintain a healthy weight.For adults 20 years and older:  A BMI below 18.5 is considered underweight.  A BMI of 18.5 to 24.9 is normal.  A BMI of 25 to 29.9 is considered overweight.  A  BMI of 30 and above is considered obese.  Maintain normal blood lipids and cholesterol levels by exercising and minimizing your intake of saturated fat. Eat a balanced diet with plenty of fruit and vegetables. Blood tests for lipids and cholesterol should begin at age 45 and be repeated every 5 years. If your lipid or cholesterol levels are high, you are over 50, or you are at high risk for heart disease, you may need your cholesterol levels checked more frequently.Ongoing high lipid and cholesterol levels should be treated with medicines if diet and exercise are not working.  If you smoke, find out from your health care provider how to quit. If you do not use tobacco, do not start.  Lung cancer screening is recommended for adults aged 45-80 years who are at high risk for developing lung cancer because of a history of smoking. A yearly low-dose CT scan of the lungs is recommended for people who have at least a 30-pack-year history of smoking and are a current smoker or have quit within the past 15 years. A pack year of smoking is smoking an average of 1 pack of cigarettes a day for 1 year (for example: 1 pack a day for 30 years or 2 packs a day for 15 years). Yearly screening should continue until the smoker has stopped smoking for at least 15 years. Yearly screening should be stopped for people who develop a health problem that would prevent them from having lung cancer treatment.  If you are pregnant, do not drink alcohol. If you are  breastfeeding, be very cautious about drinking alcohol. If you are not pregnant and choose to drink alcohol, do not have more than 1 drink per day. One drink is considered to be 12 ounces (355 mL) of beer, 5 ounces (148 mL) of wine, or 1.5 ounces (44 mL) of liquor.  Avoid use of street drugs. Do not share needles with anyone. Ask for help if you need support or instructions about stopping the use of drugs.  High blood pressure causes heart disease and increases the risk  of stroke. Your blood pressure should be checked at least every 1 to 2 years. Ongoing high blood pressure should be treated with medicines if weight loss and exercise do not work.  If you are 55-79 years old, ask your health care provider if you should take aspirin to prevent strokes.  Diabetes screening is done by taking a blood sample to check your blood glucose level after you have not eaten for a certain period of time (fasting). If you are not overweight and you do not have risk factors for diabetes, you should be screened once every 3 years starting at age 45. If you are overweight or obese and you are 40-70 years of age, you should be screened for diabetes every year as part of your cardiovascular risk assessment.  Breast cancer screening is essential preventive care for women. You should practice "breast self-awareness." This means understanding the normal appearance and feel of your breasts and may include breast self-examination. Any changes detected, no matter how small, should be reported to a health care provider. Women in their 20s and 30s should have a clinical breast exam (CBE) by a health care provider as part of a regular health exam every 1 to 3 years. After age 40, women should have a CBE every year. Starting at age 40, women should consider having a mammogram (breast X-ray test) every year. Women who have a family history of breast cancer should talk to their health care provider about genetic screening. Women at a high risk of breast cancer should talk to their health care providers about having an MRI and a mammogram every year.  Breast cancer gene (BRCA)-related cancer risk assessment is recommended for women who have family members with BRCA-related cancers. BRCA-related cancers include breast, ovarian, tubal, and peritoneal cancers. Having family members with these cancers may be associated with an increased risk for harmful changes (mutations) in the breast cancer genes BRCA1 and  BRCA2. Results of the assessment will determine the need for genetic counseling and BRCA1 and BRCA2 testing.  Your health care provider may recommend that you be screened regularly for cancer of the pelvic organs (ovaries, uterus, and vagina). This screening involves a pelvic examination, including checking for microscopic changes to the surface of your cervix (Pap test). You may be encouraged to have this screening done every 3 years, beginning at age 21.  For women ages 30-65, health care providers may recommend pelvic exams and Pap testing every 3 years, or they may recommend the Pap and pelvic exam, combined with testing for human papilloma virus (HPV), every 5 years. Some types of HPV increase your risk of cervical cancer. Testing for HPV may also be done on women of any age with unclear Pap test results.  Other health care providers may not recommend any screening for nonpregnant women who are considered low risk for pelvic cancer and who do not have symptoms. Ask your health care provider if a screening pelvic exam is right for   you.  If you have had past treatment for cervical cancer or a condition that could lead to cancer, you need Pap tests and screening for cancer for at least 20 years after your treatment. If Pap tests have been discontinued, your risk factors (such as having a new sexual partner) need to be reassessed to determine if screening should resume. Some women have medical problems that increase the chance of getting cervical cancer. In these cases, your health care provider may recommend more frequent screening and Pap tests.  Colorectal cancer can be detected and often prevented. Most routine colorectal cancer screening begins at the age of 50 years and continues through age 75 years. However, your health care provider may recommend screening at an earlier age if you have risk factors for colon cancer. On a yearly basis, your health care provider may provide home test kits to check  for hidden blood in the stool. Use of a small camera at the end of a tube, to directly examine the colon (sigmoidoscopy or colonoscopy), can detect the earliest forms of colorectal cancer. Talk to your health care provider about this at age 50, when routine screening begins. Direct exam of the colon should be repeated every 5-10 years through age 75 years, unless early forms of precancerous polyps or small growths are found.  People who are at an increased risk for hepatitis B should be screened for this virus. You are considered at high risk for hepatitis B if:  You were born in a country where hepatitis B occurs often. Talk with your health care provider about which countries are considered high risk.  Your parents were born in a high-risk country and you have not received a shot to protect against hepatitis B (hepatitis B vaccine).  You have HIV or AIDS.  You use needles to inject street drugs.  You live with, or have sex with, someone who has hepatitis B.  You get hemodialysis treatment.  You take certain medicines for conditions like cancer, organ transplantation, and autoimmune conditions.  Hepatitis C blood testing is recommended for all people born from 1945 through 1965 and any individual with known risks for hepatitis C.  Practice safe sex. Use condoms and avoid high-risk sexual practices to reduce the spread of sexually transmitted infections (STIs). STIs include gonorrhea, chlamydia, syphilis, trichomonas, herpes, HPV, and human immunodeficiency virus (HIV). Herpes, HIV, and HPV are viral illnesses that have no cure. They can result in disability, cancer, and death.  You should be screened for sexually transmitted illnesses (STIs) including gonorrhea and chlamydia if:  You are sexually active and are younger than 24 years.  You are older than 24 years and your health care provider tells you that you are at risk for this type of infection.  Your sexual activity has changed  since you were last screened and you are at an increased risk for chlamydia or gonorrhea. Ask your health care provider if you are at risk.  If you are at risk of being infected with HIV, it is recommended that you take a prescription medicine daily to prevent HIV infection. This is called preexposure prophylaxis (PrEP). You are considered at risk if:  You are sexually active and do not regularly use condoms or know the HIV status of your partner(s).  You take drugs by injection.  You are sexually active with a partner who has HIV.  Talk with your health care provider about whether you are at high risk of being infected with HIV. If   you choose to begin PrEP, you should first be tested for HIV. You should then be tested every 3 months for as long as you are taking PrEP.  Osteoporosis is a disease in which the bones lose minerals and strength with aging. This can result in serious bone fractures or breaks. The risk of osteoporosis can be identified using a bone density scan. Women ages 67 years and over and women at risk for fractures or osteoporosis should discuss screening with their health care providers. Ask your health care provider whether you should take a calcium supplement or vitamin D to reduce the rate of osteoporosis.  Menopause can be associated with physical symptoms and risks. Hormone replacement therapy is available to decrease symptoms and risks. You should talk to your health care provider about whether hormone replacement therapy is right for you.  Use sunscreen. Apply sunscreen liberally and repeatedly throughout the day. You should seek shade when your shadow is shorter than you. Protect yourself by wearing long sleeves, pants, a wide-brimmed hat, and sunglasses year round, whenever you are outdoors.  Once a month, do a whole body skin exam, using a mirror to look at the skin on your back. Tell your health care provider of new moles, moles that have irregular borders, moles that  are larger than a pencil eraser, or moles that have changed in shape or color.  Stay current with required vaccines (immunizations).  Influenza vaccine. All adults should be immunized every year.  Tetanus, diphtheria, and acellular pertussis (Td, Tdap) vaccine. Pregnant women should receive 1 dose of Tdap vaccine during each pregnancy. The dose should be obtained regardless of the length of time since the last dose. Immunization is preferred during the 27th-36th week of gestation. An adult who has not previously received Tdap or who does not know her vaccine status should receive 1 dose of Tdap. This initial dose should be followed by tetanus and diphtheria toxoids (Td) booster doses every 10 years. Adults with an unknown or incomplete history of completing a 3-dose immunization series with Td-containing vaccines should begin or complete a primary immunization series including a Tdap dose. Adults should receive a Td booster every 10 years.  Varicella vaccine. An adult without evidence of immunity to varicella should receive 2 doses or a second dose if she has previously received 1 dose. Pregnant females who do not have evidence of immunity should receive the first dose after pregnancy. This first dose should be obtained before leaving the health care facility. The second dose should be obtained 4-8 weeks after the first dose.  Human papillomavirus (HPV) vaccine. Females aged 13-26 years who have not received the vaccine previously should obtain the 3-dose series. The vaccine is not recommended for use in pregnant females. However, pregnancy testing is not needed before receiving a dose. If a female is found to be pregnant after receiving a dose, no treatment is needed. In that case, the remaining doses should be delayed until after the pregnancy. Immunization is recommended for any person with an immunocompromised condition through the age of 61 years if she did not get any or all doses earlier. During the  3-dose series, the second dose should be obtained 4-8 weeks after the first dose. The third dose should be obtained 24 weeks after the first dose and 16 weeks after the second dose.  Zoster vaccine. One dose is recommended for adults aged 30 years or older unless certain conditions are present.  Measles, mumps, and rubella (MMR) vaccine. Adults born  before 1957 generally are considered immune to measles and mumps. Adults born in 1957 or later should have 1 or more doses of MMR vaccine unless there is a contraindication to the vaccine or there is laboratory evidence of immunity to each of the three diseases. A routine second dose of MMR vaccine should be obtained at least 28 days after the first dose for students attending postsecondary schools, health care workers, or international travelers. People who received inactivated measles vaccine or an unknown type of measles vaccine during 1963-1967 should receive 2 doses of MMR vaccine. People who received inactivated mumps vaccine or an unknown type of mumps vaccine before 1979 and are at high risk for mumps infection should consider immunization with 2 doses of MMR vaccine. For females of childbearing age, rubella immunity should be determined. If there is no evidence of immunity, females who are not pregnant should be vaccinated. If there is no evidence of immunity, females who are pregnant should delay immunization until after pregnancy. Unvaccinated health care workers born before 1957 who lack laboratory evidence of measles, mumps, or rubella immunity or laboratory confirmation of disease should consider measles and mumps immunization with 2 doses of MMR vaccine or rubella immunization with 1 dose of MMR vaccine.  Pneumococcal 13-valent conjugate (PCV13) vaccine. When indicated, a person who is uncertain of his immunization history and has no record of immunization should receive the PCV13 vaccine. All adults 65 years of age and older should receive this  vaccine. An adult aged 19 years or older who has certain medical conditions and has not been previously immunized should receive 1 dose of PCV13 vaccine. This PCV13 should be followed with a dose of pneumococcal polysaccharide (PPSV23) vaccine. Adults who are at high risk for pneumococcal disease should obtain the PPSV23 vaccine at least 8 weeks after the dose of PCV13 vaccine. Adults older than 63 years of age who have normal immune system function should obtain the PPSV23 vaccine dose at least 1 year after the dose of PCV13 vaccine.  Pneumococcal polysaccharide (PPSV23) vaccine. When PCV13 is also indicated, PCV13 should be obtained first. All adults aged 65 years and older should be immunized. An adult younger than age 65 years who has certain medical conditions should be immunized. Any person who resides in a nursing home or long-term care facility should be immunized. An adult smoker should be immunized. People with an immunocompromised condition and certain other conditions should receive both PCV13 and PPSV23 vaccines. People with human immunodeficiency virus (HIV) infection should be immunized as soon as possible after diagnosis. Immunization during chemotherapy or radiation therapy should be avoided. Routine use of PPSV23 vaccine is not recommended for American Indians, [place] Natives, or people younger than 65 years unless there are medical conditions that require PPSV23 vaccine. When indicated, people who have unknown immunization and have no record of immunization should receive PPSV23 vaccine. One-time revaccination 5 years after the first dose of PPSV23 is recommended for people aged 19-64 years who have chronic kidney failure, nephrotic syndrome, asplenia, or immunocompromised conditions. People who received 1-2 doses of PPSV23 before age 65 years should receive another dose of PPSV23 vaccine at age 65 years or later if at least 5 years have passed since the previous dose. Doses of PPSV23 are not  needed for people immunized with PPSV23 at or after age 65 years.  Meningococcal vaccine. Adults with asplenia or persistent complement component deficiencies should receive 2 doses of quadrivalent meningococcal conjugate (MenACWY-D) vaccine. The doses should be obtained   at least 2 months apart. Microbiologists working with certain meningococcal bacteria, [place] recruits, people at risk during an outbreak, and people who travel to or live in countries with a high rate of meningitis should be immunized. A first-year college student up through age 34 years who is living in a residence hall should receive a dose if she did not receive a dose on or after her 16th birthday. Adults who have certain high-risk conditions should receive one or more doses of vaccine.  Hepatitis A vaccine. Adults who wish to be protected from this disease, have certain high-risk conditions, work with hepatitis A-infected animals, work in hepatitis A research labs, or travel to or work in countries with a high rate of hepatitis A should be immunized. Adults who were previously unvaccinated and who anticipate close contact with an international adoptee during the first 60 days after arrival in the [place] from a country with a high rate of hepatitis A should be immunized.  Hepatitis B vaccine. Adults who wish to be protected from this disease, have certain high-risk conditions, may be exposed to blood or other infectious body fluids, are household contacts or sex partners of hepatitis B positive people, are clients or workers in certain care facilities, or travel to or work in countries with a high rate of hepatitis B should be immunized.  Haemophilus influenzae type b (Hib) vaccine. A previously unvaccinated person with asplenia or sickle cell disease or having a scheduled splenectomy should receive 1 dose of Hib vaccine. Regardless of previous immunization, a recipient of a hematopoietic stem cell transplant should receive a  3-dose series 6-12 months after her successful transplant. Hib vaccine is not recommended for adults with HIV infection. Preventive Services / Frequency Ages 35 to 4 years  Blood pressure check.** / Every 3-5 years.  Lipid and cholesterol check.** / Every 5 years beginning at age 60.  Clinical breast exam.** / Every 3 years for women in their 71s and 10s.  BRCA-related cancer risk assessment.** / For women who have family members with a BRCA-related cancer (breast, ovarian, tubal, or peritoneal cancers).  Pap test.** / Every 2 years from ages 76 through 26. Every 3 years starting at age 61 through age 76 or 93 with a history of 3 consecutive normal Pap tests.  HPV screening.** / Every 3 years from ages 37 through ages 60 to 51 with a history of 3 consecutive normal Pap tests.  Hepatitis C blood test.** / For any individual with known risks for hepatitis C.  Skin self-exam. / Monthly.  Influenza vaccine. / Every year.  Tetanus, diphtheria, and acellular pertussis (Tdap, Td) vaccine.** / Consult your health care provider. Pregnant women should receive 1 dose of Tdap vaccine during each pregnancy. 1 dose of Td every 10 years.  Varicella vaccine.** / Consult your health care provider. Pregnant females who do not have evidence of immunity should receive the first dose after pregnancy.  HPV vaccine. / 3 doses over 6 months, if 93 and younger. The vaccine is not recommended for use in pregnant females. However, pregnancy testing is not needed before receiving a dose.  Measles, mumps, rubella (MMR) vaccine.** / You need at least 1 dose of MMR if you were born in 1957 or later. You may also need a 2nd dose. For females of childbearing age, rubella immunity should be determined. If there is no evidence of immunity, females who are not pregnant should be vaccinated. If there is no evidence of immunity, females who are  pregnant should delay immunization until after pregnancy.  Pneumococcal  13-valent conjugate (PCV13) vaccine.** / Consult your health care provider.  Pneumococcal polysaccharide (PPSV23) vaccine.** / 1 to 2 doses if you smoke cigarettes or if you have certain conditions.  Meningococcal vaccine.** / 1 dose if you are age 68 to 8 years and a Market researcher living in a residence hall, or have one of several medical conditions, you need to get vaccinated against meningococcal disease. You may also need additional booster doses.  Hepatitis A vaccine.** / Consult your health care provider.  Hepatitis B vaccine.** / Consult your health care provider.  Haemophilus influenzae type b (Hib) vaccine.** / Consult your health care provider. Ages 7 to 53 years  Blood pressure check.** / Every year.  Lipid and cholesterol check.** / Every 5 years beginning at age 25 years.  Lung cancer screening. / Every year if you are aged 11-80 years and have a 30-pack-year history of smoking and currently smoke or have quit within the past 15 years. Yearly screening is stopped once you have quit smoking for at least 15 years or develop a health problem that would prevent you from having lung cancer treatment.  Clinical breast exam.** / Every year after age 48 years.  BRCA-related cancer risk assessment.** / For women who have family members with a BRCA-related cancer (breast, ovarian, tubal, or peritoneal cancers).  Mammogram.** / Every year beginning at age 41 years and continuing for as long as you are in good health. Consult with your health care provider.  Pap test.** / Every 3 years starting at age 65 years through age 37 or 70 years with a history of 3 consecutive normal Pap tests.  HPV screening.** / Every 3 years from ages 72 years through ages 60 to 40 years with a history of 3 consecutive normal Pap tests.  Fecal occult blood test (FOBT) of stool. / Every year beginning at age 21 years and continuing until age 5 years. You may not need to do this test if you get  a colonoscopy every 10 years.  Flexible sigmoidoscopy or colonoscopy.** / Every 5 years for a flexible sigmoidoscopy or every 10 years for a colonoscopy beginning at age 35 years and continuing until age 48 years.  Hepatitis C blood test.** / For all people born from [birth year] through 1965 and any individual with known risks for hepatitis C.  Skin self-exam. / Monthly.  Influenza vaccine. / Every year.  Tetanus, diphtheria, and acellular pertussis (Tdap/Td) vaccine.** / Consult your health care provider. Pregnant women should receive 1 dose of Tdap vaccine during each pregnancy. 1 dose of Td every 10 years.  Varicella vaccine.** / Consult your health care provider. Pregnant females who do not have evidence of immunity should receive the first dose after pregnancy.  Zoster vaccine.** / 1 dose for adults aged 30 years or older.  Measles, mumps, rubella (MMR) vaccine.** / You need at least 1 dose of MMR if you were born in 1957 or later. You may also need a second dose. For females of childbearing age, rubella immunity should be determined. If there is no evidence of immunity, females who are not pregnant should be vaccinated. If there is no evidence of immunity, females who are pregnant should delay immunization until after pregnancy.  Pneumococcal 13-valent conjugate (PCV13) vaccine.** / Consult your health care provider.  Pneumococcal polysaccharide (PPSV23) vaccine.** / 1 to 2 doses if you smoke cigarettes or if you have certain conditions.  Meningococcal vaccine.** /  Consult your health care provider.  Hepatitis A vaccine.** / Consult your health care provider.  Hepatitis B vaccine.** / Consult your health care provider.  Haemophilus influenzae type b (Hib) vaccine.** / Consult your health care provider. Ages 64 years and over  Blood pressure check.** / Every year.  Lipid and cholesterol check.** / Every 5 years beginning at age 23 years.  Lung cancer screening. / Every year if you  are aged 16-80 years and have a 30-pack-year history of smoking and currently smoke or have quit within the past 15 years. Yearly screening is stopped once you have quit smoking for at least 15 years or develop a health problem that would prevent you from having lung cancer treatment.  Clinical breast exam.** / Every year after age 74 years.  BRCA-related cancer risk assessment.** / For women who have family members with a BRCA-related cancer (breast, ovarian, tubal, or peritoneal cancers).  Mammogram.** / Every year beginning at age 44 years and continuing for as long as you are in good health. Consult with your health care provider.  Pap test.** / Every 3 years starting at age 58 years through age 22 or 39 years with 3 consecutive normal Pap tests. Testing can be stopped between 65 and 70 years with 3 consecutive normal Pap tests and no abnormal Pap or HPV tests in the past 10 years.  HPV screening.** / Every 3 years from ages 64 years through ages 70 or 61 years with a history of 3 consecutive normal Pap tests. Testing can be stopped between 65 and 70 years with 3 consecutive normal Pap tests and no abnormal Pap or HPV tests in the past 10 years.  Fecal occult blood test (FOBT) of stool. / Every year beginning at age 40 years and continuing until age 27 years. You may not need to do this test if you get a colonoscopy every 10 years.  Flexible sigmoidoscopy or colonoscopy.** / Every 5 years for a flexible sigmoidoscopy or every 10 years for a colonoscopy beginning at age 7 years and continuing until age 32 years.  Hepatitis C blood test.** / For all people born from [birth year] through 1963 and any individual with known risks for hepatitis C.  Osteoporosis screening.** / A one-time screening for women ages 30 years and over and women at risk for fractures or osteoporosis.  Skin self-exam. / Monthly.  Influenza vaccine. / Every year.  Tetanus, diphtheria, and acellular pertussis (Tdap/Td)  vaccine.** / 1 dose of Td every 10 years.  Varicella vaccine.** / Consult your health care provider.  Zoster vaccine.** / 1 dose for adults aged 35 years or older.  Pneumococcal 13-valent conjugate (PCV13) vaccine.** / Consult your health care provider.  Pneumococcal polysaccharide (PPSV23) vaccine.** / 1 dose for all adults aged 46 years and older.  Meningococcal vaccine.** / Consult your health care provider.  Hepatitis A vaccine.** / Consult your health care provider.  Hepatitis B vaccine.** / Consult your health care provider.  Haemophilus influenzae type b (Hib) vaccine.** / Consult your health care provider. ** Family history and personal history of risk and conditions may change your health care provider's recommendations.   This information is not intended to replace advice given to you by your health care provider. Make sure you discuss any questions you have with your health care provider.   Document Released: 02/25/2001 Document Revised: 01/20/2014 Document Reviewed: 05/27/2010 Elsevier Interactive Patient Education [employer].

## 2015-03-27 NOTE — Progress Notes (Signed)
Pre visit review using our clinic review tool, if applicable. No additional management support is needed unless otherwise documented below in the visit note. 

## 2015-03-27 NOTE — Progress Notes (Signed)
Subjective:     Lydia Joyce is a 65 y.o. female and is here for a comprehensive physical exam. The patient reports no problems.  Social History   Social History  . Marital Status: Divorced    Spouse Name: N/A  . Number of Children: N/A  . Years of Education: N/A   Occupational History  . Not on file.   Social History Main Topics  . Smoking status: Never Smoker   . Smokeless tobacco: Never Used  . Alcohol Use: No  . Drug Use: No  . Sexual Activity:    Partners: Male   Other Topics Concern  . Not on file   Social History Narrative   Exercise-- walk, treadmill 2x a week   Health Maintenance  Topic Date Due  . Hepatitis C Screening  03/14/1950  . ZOSTAVAX  09/19/2012  . PAP SMEAR  11/22/2014  . HIV Screening  06/25/2015 (Originally 09/20/1965)  . INFLUENZA VACCINE  08/14/2015  . MAMMOGRAM  12/26/2015  . COLONOSCOPY  11/04/2017  . TETANUS/TDAP  06/02/2020    The following portions of the patient's history were reviewed and updated as appropriate:  She  has a past medical history of Hypertension; Thyroid disease; Arthritis; and Chicken pox. She  does not have any pertinent problems on file. She  has past surgical history that includes Hernia repair. Her family history includes Arthritis in her father and mother; Hypertension in her father; Stroke in her father and mother; Thyroid disease in her sister. She  reports that she has never smoked. She has never used smokeless tobacco. She reports that she does not drink alcohol or use illicit drugs. She has a current medication list which includes the following prescription(s): ascriptin, vitamin d3, iron, hydrochlorothiazide, levothyroxine, glucosamine chond complex/msm, and multivitamin. Current Outpatient Prescriptions on File Prior to Visit  Medication Sig Dispense Refill  . Aspirin Buf,AlHyd-MgHyd-CaCar, (ASCRIPTIN) 325 MG TABS 1 po qd  0  . Cholecalciferol (VITAMIN D3) 5000 UNITS CAPS Take by mouth.    . Ferrous  Sulfate (IRON) 325 (65 FE) MG TABS Take 1 tablet by mouth daily.    . Misc Natural Products (GLUCOSAMINE CHOND COMPLEX/MSM) TABS Take 1 tablet by mouth daily.    . Multiple Vitamin (MULTIVITAMIN) tablet Take 1 tablet by mouth daily.     No current facility-administered medications on file prior to visit.   She is allergic to lisinopril..  Review of Systems Review of Systems  Constitutional: Negative for activity change, appetite change and fatigue.  HENT: Negative for hearing loss, congestion, tinnitus and ear discharge.  dentist q80mEyes: Negative for visual disturbance (see optho q1y -- vision corrected to 20/20 with glasses).  Respiratory: Negative for cough, chest tightness and shortness of breath.   Cardiovascular: Negative for chest pain, palpitations and leg swelling.  Gastrointestinal: Negative for abdominal pain, diarrhea, constipation and abdominal distention.  Genitourinary: Negative for urgency, frequency, decreased urine volume and difficulty urinating.  Musculoskeletal: Negative for back pain, arthralgias and gait problem.  Skin: Negative for color change, pallor and rash.  Neurological: Negative for dizziness, light-headedness, numbness and headaches.  Hematological: Negative for adenopathy. Does not bruise/bleed easily.  Psychiatric/Behavioral: Negative for suicidal ideas, confusion, sleep disturbance, self-injury, dysphoric mood, decreased concentration and agitation.      Objective:    BP 132/82 mmHg  Pulse 69  Temp(Src) 97.6 F (36.4 C) (Oral)  Ht _0  (1.727 m)  Wt 168 lb (76.204 kg)  BMI 25.55 kg/m2  SpO2 93% General appearance:  alert, cooperative, appears stated age and no distress Head: Normocephalic, without obvious abnormality, atraumatic Eyes: conjunctivae/corneas clear. PERRL, EOM's intact. Fundi benign. Ears: normal TM's and external ear canals both ears Nose: Nares normal. Septum midline. Mucosa normal. No drainage or sinus tenderness. Throat:  lips, mucosa, and tongue normal; teeth and gums normal Neck: no adenopathy, no carotid bruit, no JVD, supple, symmetrical, trachea midline and thyroid not enlarged, symmetric, no tenderness/mass/nodules Back: symmetric, no curvature. ROM normal. No CVA tenderness. Lungs: clear to auscultation bilaterally Breasts: normal appearance, no masses or tenderness Heart: regular rate and rhythm, S1, S2 normal, no murmur, click, rub or gallop Abdomen: soft, non-tender; bowel sounds normal; no masses,  no organomegaly Pelvic: cervix normal in appearance, external genitalia normal, no adnexal masses or tenderness, no cervical motion tenderness, rectovaginal septum normal, uterus normal size, shape, and consistency, vagina normal without discharge and pap done----rectal- heme neg brown stool Extremities: extremities normal, atraumatic, no cyanosis or edema Pulses: 2+ and symmetric Skin: Skin color, texture, turgor normal. No rashes or lesions Lymph nodes: Cervical, supraclavicular, and axillary nodes normal. Neurologic: Alert and oriented X 3, normal strength and tone. Normal symmetric reflexes. Normal coordination and gait Psych- no depression, no anxiety      Assessment:    Healthy female exam.      Plan:     ghm utd Check labs See After Visit Summary for Counseling Recommendations    1. Need for hepatitis C screening test  - Hepatitis C antibody  2. Encounter for screening for HIV  - HIV antibody  3. Hypothyroidism, unspecified hypothyroidism type Stable Check labs - TSH - Comp Met (CMET) - levothyroxine (SYNTHROID, LEVOTHROID) 50 MCG tablet; Take 1 tablet (50 mcg total) by mouth daily.  Dispense: 90 tablet; Refill: 3  4. Hyperlipidemia Check lab-- on no meds - Lipid panel - Comp Met (CMET)  5. Essential hypertension stable - CBC with Differential/Platelet - POCT urinalysis dipstick - Comp Met (CMET) - hydrochlorothiazide (HYDRODIURIL) 25 MG tablet; Take 1 tablet (25 mg total)  by mouth daily.  Dispense: 90 tablet; Refill: 3  6. Osteopenia   - Vitamin D 1,25 dihydroxy  7. Anemia, iron deficiency   - IBC panel - Ferritin  8. Preventative health care

## 2015-03-28 LAB — LIPID PANEL
Cholesterol: 198 mg/dL (ref 0–200)
HDL: 54.6 mg/dL (ref >39.00)
LDL CALC: 123 mg/dL — AB (ref 0–99)
NonHDL: 143.31
Total CHOL/HDL Ratio: 4
Triglycerides: 103 mg/dL (ref 0.0–149.0)
VLDL: 20.6 mg/dL (ref 0.0–40.0)

## 2015-03-28 LAB — CBC WITH DIFFERENTIAL/PLATELET
BASOS ABS: 0 10*3/uL (ref 0.0–0.1)
Basophils Relative: 0.3 % (ref 0.0–3.0)
EOS PCT: 1.8 % (ref 0.0–5.0)
Eosinophils Absolute: 0.1 10*3/uL (ref 0.0–0.7)
HCT: 37.7 % (ref 36.0–46.0)
HEMOGLOBIN: 12.7 g/dL (ref 12.0–15.0)
LYMPHS ABS: 1.6 10*3/uL (ref 0.7–4.0)
Lymphocytes Relative: 38.6 % (ref 12.0–46.0)
MCHC: 33.7 g/dL (ref 30.0–36.0)
MCV: 86.4 fl (ref 78.0–100.0)
Monocytes Absolute: 0.3 10*3/uL (ref 0.1–1.0)
Monocytes Relative: 6.8 % (ref 3.0–12.0)
Neutro Abs: 2.2 10*3/uL (ref 1.4–7.7)
Neutrophils Relative %: 52.5 % (ref 43.0–77.0)
Platelets: 243 10*3/uL (ref 150.0–400.0)
RBC: 4.36 Mil/uL (ref 3.87–5.11)
RDW: 14.3 % (ref 11.5–15.5)
WBC: 4.2 10*3/uL (ref 4.0–10.5)

## 2015-03-28 LAB — COMPREHENSIVE METABOLIC PANEL
ALT: 22 U/L (ref 0–35)
AST: 27 U/L (ref 0–37)
Albumin: 4.4 g/dL (ref 3.5–5.2)
Alkaline Phosphatase: 60 U/L (ref 39–117)
BUN: 12 mg/dL (ref 6–23)
CHLORIDE: 98 meq/L (ref 96–112)
CO2: 33 meq/L — AB (ref 19–32)
CREATININE: 1.09 mg/dL (ref 0.40–1.20)
Calcium: 9.8 mg/dL (ref 8.4–10.5)
GFR: 65.3 mL/min (ref >60.00)
GLUCOSE: 83 mg/dL (ref 70–99)
Potassium: 3.3 mEq/L — ABNORMAL LOW (ref 3.5–5.1)
SODIUM: 138 meq/L (ref 135–145)
Total Bilirubin: 0.7 mg/dL (ref 0.2–1.2)
Total Protein: 7.5 g/dL (ref 6.0–8.3)

## 2015-03-28 LAB — IBC PANEL
IRON: 108 ug/dL (ref 42–145)
Saturation Ratios: 26.8 % (ref 20.0–50.0)
TRANSFERRIN: 288 mg/dL (ref 212.0–360.0)

## 2015-03-28 LAB — TSH: TSH: 0.71 u[IU]/mL (ref 0.35–4.50)

## 2015-03-28 LAB — FERRITIN: FERRITIN: 85.6 ng/mL (ref 10.0–291.0)

## 2015-03-28 LAB — HEPATITIS C ANTIBODY: HCV Ab: NEGATIVE

## 2015-03-28 LAB — HIV ANTIBODY (ROUTINE TESTING W REFLEX): HIV: NONREACTIVE

## 2015-03-29 LAB — CYTOLOGY - PAP

## 2015-03-30 LAB — VITAMIN D 1,25 DIHYDROXY
Vitamin D 1, 25 (OH)2 Total: 43 pg/mL (ref 18–72)
Vitamin D3 1, 25 (OH)2: 43 pg/mL

## 2015-04-06 ENCOUNTER — Telehealth: Payer: Self-pay | Admitting: Family Medicine

## 2015-04-06 ENCOUNTER — Other Ambulatory Visit: Payer: Self-pay | Admitting: Family Medicine

## 2015-04-06 DIAGNOSIS — E876 Hypokalemia: Secondary | ICD-10-CM

## 2015-04-06 DIAGNOSIS — E785 Hyperlipidemia, unspecified: Secondary | ICD-10-CM

## 2015-04-06 DIAGNOSIS — E7800 Pure hypercholesterolemia, unspecified: Secondary | ICD-10-CM

## 2015-04-06 NOTE — Telephone Encounter (Addendum)
Notes Recorded by Rosalita Chessman, DO on 04/05/2015 at 8:09 PM Cholesterol--- LDL goal < 100, HDL >40, TG < 150. Diet and exercise will increase HDL and decrease LDL and TG. Fish, Fish Oil, Flaxseed oil will also help increase the HDL and decrease Triglycerides.  Recheck labs in 3 months--- potassium is low--- eat potassium rich foods.  Lipid, cmp.  Called to go over labs over the phone and to make her aware that labs have been printed and mailed.  Pt stated she did not have time to go over labs over the phone.  She would rather have labs mailed to her.  States she will call back to schedule lab appt.  Future labs ordered.  Lab results mailed as requested.

## 2015-04-06 NOTE — Telephone Encounter (Signed)
°  Relation to [id number] Call back number:[phone] Pharmacy:  Reason for call: pt states she never received her results from her labs, pt had cpe on 03/27/15, states she doesn't use her my chart, would like for you to mail a copy of her results to her.

## 2015-05-11 ENCOUNTER — Telehealth: Payer: Self-pay | Admitting: Family Medicine

## 2015-05-11 NOTE — Telephone Encounter (Signed)
error:[id number] ° °

## 2015-05-18 ENCOUNTER — Telehealth: Payer: Self-pay | Admitting: Family Medicine

## 2015-05-18 NOTE — Telephone Encounter (Signed)
Thank you :)

## 2015-05-18 NOTE — Telephone Encounter (Signed)
Pt called in with a billing concern. Pt says that she was seen on 3/14 CPE . Pt says that provider informed her that all providers were advised to do an HIV, Hep C. Pt says that her insurance is disputing covering due to them not being necessary. Pt says that she only had them completed because she felt that they were necessary.    Pt would like to have someone to look further into the coding to be sure that they were coded correctly so that they are covered by insurance.    Please assist pt further.    CB: [phone]

## 2015-05-18 NOTE — Telephone Encounter (Signed)
Codes should have been  Dx--  Need for hep c and hiv screen----

## 2015-05-18 NOTE — Telephone Encounter (Signed)
Left patient a message to call back. I have sent this to coding to review will follow with patient when I hear back from coding

## 2015-05-18 NOTE — Telephone Encounter (Signed)
Spoke with coding and the codes dx listed are z11.4 and z11.59 which is consistent with provider's documentation. I will reach out to patient to get the billing for and contact [facility] to see what procedure codes where used to file this. According to coding g codes [id number] and [phone] or [phone] could have been used to for insurance to cover

## 2015-05-24 NOTE — Telephone Encounter (Signed)
[place] please check with [facility] to see how we can get this covered

## 2015-06-12 NOTE — Telephone Encounter (Signed)
Can be reached: [phone]  Reason for call: Pt called [facility] again and they said that they spoke with [facility] but they do not have codes to rebill. Fwding to office mgr.

## 2015-06-14 NOTE — Telephone Encounter (Signed)
Called patient and informed her that per coding and [facility] the coding is correct and the labs have been applied to her deductible. That due to dx of hyperlipidemia, hypertension, and hypothyroidism some of her labs were not covered in her visit. Patient was very upset and stated she was not informed of these dx and that she did not ask to have the test done. She would like a call back to discuss her medicate diagnosis. [phone] note has been forwarded to [place] team lead to call patient and go over results from previous labs. Patient will then call insurance to verify why the insurance has applied payment to deductible and find out what codes they will cover

## 2015-06-14 NOTE — Telephone Encounter (Signed)
Patient would like a call back regarding her dx of hyperlipidemia. Her insurance is rejecting payment because of this dx and will not cover lipid panel please call patient to discuss previous dx. [phone]

## 2015-06-15 NOTE — Telephone Encounter (Addendum)
Called to follow up with patient.  Pt wanted to know when she was first diagnosed with hyperlipidemia.  In reviewing patient's chart, the first time hyperlipidemia was noted was 06/26/14.  Pt states this was upsetting because she was unaware that hyperlipidemia had been added.  States she is very careful in what she eats and she exercises to prevent elevations in her cholesterol and now she is concerned that because hyperlipidemia is listed her insurance will not cover certain things because she has a pre-existing condition.  Per patient, since her last visit, she has been receiving bills that she was told would be covered by her insurance, because she came in for her annual exam.  She says she received a bill for a $25 copay (which she was told at check in she would not have to pay), $45.29 from [facility] for expenses not covered by her insurance and $71 bill for labs.  She says that she has spoken to her insurance company, and per her insurance company the wrong codes were put in and that's why the services are not being covered. Per patient, her insurance company says that preventative codes should have been used rather than the codes used.  She said she was told that if the services are re-coded with the preventative codes, they would cover everything. Pt states she is retired and cannot afford to come to the doctor if she continues to receive bills such as the ones received from her last visit.  She would like for the provider to recode the services provided and then to receive follow up regarding the same sometimes next week.

## 2015-06-19 NOTE — Telephone Encounter (Signed)
Pt called in to speak back with Ebony. She says that she need someone to call her to explain the Dx that was submitted. (RN) She says that RN was going to have Ebony to resubmit Dx.  Pt is requesting to speak with CMA to see why Dx was submitted. Please call back to assist further.     CB: [phone]

## 2015-06-21 NOTE — Telephone Encounter (Signed)
Dr.Lowne please review and advise    KP

## 2015-06-21 NOTE — Telephone Encounter (Signed)
I discussed with the patient and she stated that she was here for preventative care and since her visit has the diagnosis for hyperlipidemia the insurance is not paying for the physical. She was even billed for the co-pay of $25 and she was not supposed to because it was a for preventative visit. The patient would like to have the information re-submitted to the insurance company with the diagnosis for preventative care. Last year her LDL was normal, and her HTN is controlled and she feels like these issues should be resolved in her chart. She said she gets [employer] manager and does not have a lot of money and she just wants to get the codes put in correctly, since the visit was not for the other diagnosis, it was for the preventative care.  Please advise      KP

## 2015-06-21 NOTE — Telephone Encounter (Signed)
Please advise. Patient is saying she was never dx with hyperlipidemia she wants to know when she was dx with this states it should have been coded at prevented instead of hyperlipidemia. I can call the patient back to explain the bill again once her medical history questions have been addressed.

## 2015-06-21 NOTE — Telephone Encounter (Signed)
She does have hyperlipidemia-- we have been telling her to work on diet and exercise Its ok to attach lipid to preventative since she was here for cpe but she does have hyperlipidemia

## 2015-06-21 NOTE — Telephone Encounter (Signed)
She is on bp meds and it does not matter that the cholesterol was not high last year--- it was high this year and repeated.  We will take it off this year but she needs to know that nothing else can be discussed during a physical in order to just get a preventative charge --- not even med refills ----- those are not our rules --- its the insurance

## 2015-06-22 NOTE — Telephone Encounter (Signed)
Message left to call the office.    KP 

## 2015-06-22 NOTE — Telephone Encounter (Signed)
She said he did not discuss any issues during her CPE, I advised that I would submit to Charlena Cross so it all can be changed to preventative and she verbalized understanding.

## 2015-06-25 NOTE — Telephone Encounter (Signed)
[place] see below I believe we are writing this off see Lowne's response

## 2015-08-22 ENCOUNTER — Telehealth: Payer: Self-pay | Admitting: Family Medicine

## 2015-08-22 NOTE — Telephone Encounter (Addendum)
Called patient, she wanted to talk to [facility]. Informed her that Maudie Mercury is out of the office and asked if I could help her. She states last time she talked to Maudie Mercury about the issue she is calling about, they came to an agreement and she wanted to follow-up with Maudie Mercury to see what was done about that. She did not want to elaborate on the reason for her call with me, stating that she thought it would be better if she just talked to [place].

## 2015-08-22 NOTE — Telephone Encounter (Signed)
°  Relationship to patient: Self  Can be reached: [phone]   Reason for call: Patient request call back from RN. Would not tell why she needed to speak with someone

## 2015-08-23 NOTE — Telephone Encounter (Signed)
Forward to [place] and ebony

## 2015-08-23 NOTE — Telephone Encounter (Signed)
Spoke with patient and se stated she is still receiving the bills from [place]. She stated she has not heard back from the office manager and wants to know what is going on. She said she asked for me b/c the last person she spoke with was rude. She said the insurance company told her all preventative care is covered at 100% but she got an [employer] is for $45.29 but [employer] told her that they paid what is allowed and she is not supposed to pay anything. She stated the lab bill was for $71.89 for the HIV, Hep C and Vitamin D, she said she did not mention any illness and she is not sure why Dr.Lowne ordered these test. She said she would like these issues resolved. She said she has left a message for [place] but never heard back from her. She said Charlena Cross was not helpful, she said that we told her it would be corrected,and she said if it was corrected when was that done and where was it sent?  She would like a call back from [place].     KP

## 2015-10-22 ENCOUNTER — Encounter: Payer: Self-pay | Admitting: Podiatry

## 2015-10-22 ENCOUNTER — Ambulatory Visit (INDEPENDENT_AMBULATORY_CARE_PROVIDER_SITE_OTHER): Payer: BLUE CROSS/BLUE SHIELD | Admitting: Podiatry

## 2015-10-22 ENCOUNTER — Ambulatory Visit (INDEPENDENT_AMBULATORY_CARE_PROVIDER_SITE_OTHER): Payer: BLUE CROSS/BLUE SHIELD

## 2015-10-22 ENCOUNTER — Ambulatory Visit: Payer: Self-pay

## 2015-10-22 DIAGNOSIS — M79671 Pain in right foot: Secondary | ICD-10-CM | POA: Diagnosis not present

## 2015-10-22 DIAGNOSIS — M79672 Pain in left foot: Secondary | ICD-10-CM

## 2015-10-22 DIAGNOSIS — M722 Plantar fascial fibromatosis: Secondary | ICD-10-CM

## 2015-10-22 MED ORDER — TRIAMCINOLONE ACETONIDE 10 MG/ML IJ SUSP
10.0000 mg | Freq: Once | INTRAMUSCULAR | Status: AC
  Administered 2015-10-22: 10 mg

## 2015-10-22 NOTE — Patient Instructions (Signed)

## 2015-10-23 NOTE — Progress Notes (Signed)
Subjective:     Patient ID: Lydia Joyce, female   DOB: [birth year], 65 y.o.   MRN: [id number]  HPI patient presents stating that she has a lot of pain in her left heel and mild in her right and it's been going on now for several months and gradually getting worse   Review of Systems  All other systems reviewed and are negative.      Objective:   Physical Exam  Constitutional: She is oriented to person, place, and time.  Cardiovascular: Intact distal pulses.   Musculoskeletal: Normal range of motion.  Neurological: She is oriented to person, place, and time.  Skin: Skin is warm.  Nursing note and vitals reviewed.  neurovascular status intact muscle strength adequate range of motion within normal limits with patient found to have discomfort plantar left heel insertional point tendon into the calcaneus with fluid buildup and patient's also noted to have mild discomfort in her right heel     Assessment:     Acute plantar fasciitis with inflammation    Plan:     H&P x-rays reviewed and focusing on the one heel and injected the plantar fascia 3 mg Kenalog 5 mill grams Xylocaine and applied fascial brace gave instructions on physical therapy and shoe gear modification and reappoint to recheck  X-rays indicate large spur formation with no indications of stress fracture

## 2015-10-24 ENCOUNTER — Ambulatory Visit: Payer: BLUE CROSS/BLUE SHIELD | Admitting: Podiatry

## 2015-11-08 ENCOUNTER — Ambulatory Visit: Payer: BLUE CROSS/BLUE SHIELD | Admitting: Podiatry

## 2015-11-22 ENCOUNTER — Ambulatory Visit (INDEPENDENT_AMBULATORY_CARE_PROVIDER_SITE_OTHER): Payer: BLUE CROSS/BLUE SHIELD | Admitting: Behavioral Health

## 2015-11-22 DIAGNOSIS — Z23 Encounter for immunization: Secondary | ICD-10-CM

## 2016-02-29 LAB — CBC AND DIFFERENTIAL
HCT: 38 (ref 36–46)
HEMOGLOBIN: 12.4 (ref 12.0–16.0)
Platelets: 246 (ref 150–399)
WBC: 4.3

## 2016-02-29 LAB — LIPID PANEL
Cholesterol: 189 (ref 0–200)
HDL: 48 (ref 35–70)
LDL CALC: 129
Triglycerides: 64 (ref 40–160)

## 2016-02-29 LAB — BASIC METABOLIC PANEL
BUN: 21 (ref 4–21)
CREATININE: 1.4 — AB (ref 0.5–1.1)
GLUCOSE: 88
Potassium: 3.8 (ref 3.4–5.3)
SODIUM: 139 (ref 137–147)

## 2016-02-29 LAB — HM MAMMOGRAPHY

## 2016-02-29 LAB — HEMOGLOBIN A1C: HEMOGLOBIN A1C: 5.7

## 2016-02-29 LAB — PULMONARY FUNCTION TEST

## 2016-02-29 LAB — HEPATIC FUNCTION PANEL
ALK PHOS: 62 (ref 25–125)
ALT: 24 (ref 7–35)
AST: 24 (ref 13–35)
BILIRUBIN, TOTAL: 0.8

## 2016-02-29 LAB — VITAMIN D 25 HYDROXY (VIT D DEFICIENCY, FRACTURES): Vit D, 25-Hydroxy: 22.05

## 2016-02-29 LAB — TSH: TSH: 1.01 (ref 0.41–5.90)

## 2016-02-29 LAB — HM PAP SMEAR

## 2016-10-03 LAB — BASIC METABOLIC PANEL
BUN: 14 (ref 4–21)
Creatinine: 1.4 — AB (ref 0.5–1.1)
Glucose: 97
Potassium: 3.6 (ref 3.4–5.3)
SODIUM: 138 (ref 137–147)

## 2016-10-03 LAB — MICROALBUMIN, URINE: MICROALB UR: 20

## 2016-10-03 LAB — LIPID PANEL
CHOLESTEROL: 196 (ref 0–200)
HDL: 49 (ref 35–70)
LDL CALC: 132
TRIGLYCERIDES: 76 (ref 40–160)

## 2016-10-03 LAB — HEPATIC FUNCTION PANEL
ALK PHOS: 67 (ref 25–125)
ALT: 22 (ref 7–35)
AST: 24 (ref 13–35)

## 2016-10-03 LAB — VITAMIN D 25 HYDROXY (VIT D DEFICIENCY, FRACTURES): Vit D, 25-Hydroxy: 19.87

## 2016-10-03 LAB — CBC AND DIFFERENTIAL
HEMATOCRIT: 39 (ref 36–46)
HEMOGLOBIN: 12.6 (ref 12.0–16.0)
PLATELETS: 231 (ref 150–399)
WBC: 4.4

## 2016-10-03 LAB — TSH: TSH: 1.19 (ref 0.41–5.90)

## 2016-10-03 LAB — HEMOGLOBIN A1C: HEMOGLOBIN A1C: 5.7

## 2016-10-20 ENCOUNTER — Other Ambulatory Visit: Payer: Self-pay | Admitting: Internal Medicine

## 2016-10-20 DIAGNOSIS — Z1231 Encounter for screening mammogram for malignant neoplasm of breast: Secondary | ICD-10-CM

## 2016-10-27 ENCOUNTER — Ambulatory Visit
Admission: RE | Admit: 2016-10-27 | Discharge: 2016-10-27 | Disposition: A | Discharge status: Home / Self Care | Payer: BLUE CROSS/BLUE SHIELD | Source: Ambulatory Visit | Attending: Internal Medicine | Admitting: Internal Medicine

## 2016-10-27 DIAGNOSIS — Z1231 Encounter for screening mammogram for malignant neoplasm of breast: Secondary | ICD-10-CM

## 2016-10-29 ENCOUNTER — Other Ambulatory Visit: Payer: Self-pay | Admitting: Internal Medicine

## 2016-10-29 DIAGNOSIS — R928 Other abnormal and inconclusive findings on diagnostic imaging of breast: Secondary | ICD-10-CM

## 2016-10-31 ENCOUNTER — Ambulatory Visit: Payer: BLUE CROSS/BLUE SHIELD

## 2016-10-31 ENCOUNTER — Ambulatory Visit
Admission: RE | Admit: 2016-10-31 | Discharge: 2016-10-31 | Disposition: A | Discharge status: Home / Self Care | Payer: BLUE CROSS/BLUE SHIELD | Source: Ambulatory Visit | Attending: Internal Medicine | Admitting: Internal Medicine

## 2016-10-31 ENCOUNTER — Other Ambulatory Visit: Payer: Self-pay | Admitting: Internal Medicine

## 2016-10-31 DIAGNOSIS — R928 Other abnormal and inconclusive findings on diagnostic imaging of breast: Secondary | ICD-10-CM

## 2017-02-24 ENCOUNTER — Encounter: Payer: Self-pay | Admitting: Family Medicine

## 2017-02-24 ENCOUNTER — Ambulatory Visit: Payer: BLUE CROSS/BLUE SHIELD | Admitting: Family Medicine

## 2017-02-24 VITALS — BP 126/84 | HR 54 | Temp 98.9°F | Ht 67.5 in | Wt 164.2 lb

## 2017-02-24 DIAGNOSIS — E039 Hypothyroidism, unspecified: Secondary | ICD-10-CM | POA: Diagnosis not present

## 2017-02-24 DIAGNOSIS — I10 Essential (primary) hypertension: Secondary | ICD-10-CM | POA: Diagnosis not present

## 2017-02-24 NOTE — Progress Notes (Signed)
Subjective:   Patient ID: Lydia Joyce, female    DOB: [birth year], 67 y.o.   MRN: [id number]  Lydia Joyce is a pleasant 67 y.o. year old female who presents to clinic today with New Patient (Initial Visit) (Patient is here today to establish care.  She has retired. )  on 02/24/2017  HPI:  HTN- has been compliant with HCTZ 25 mg daily. Denies HA, blurred vision, CP, or LE edema. Experience angioedema with ACEI. Lab Results  Component Value Date   CREATININE 1.09 03/27/2015   Lab Results  Component Value Date   NA 138 03/27/2015   K 3.3 (L) 03/27/2015   CL 98 03/27/2015   CO2 33 (H) 03/27/2015   Hypothyroidism- Currently taking synthroid 50 mcg daily.  Denies any symptoms of hypo or hyperthyroidism.  Had labs done 6 months ago. Lab Results  Component Value Date   TSH 0.71 03/27/2015   Lab Results  Component Value Date   CHOL 198 03/27/2015   HDL 54.60 03/27/2015   LDLCALC 123 (H) 03/27/2015   TRIG 103.0 03/27/2015   CHOLHDL 4 03/27/2015    Anemia- taking iron. Lab Results  Component Value Date   WBC 4.2 03/27/2015   HGB 12.7 03/27/2015   HCT 37.7 03/27/2015   MCV 86.4 03/27/2015   PLT 243.0 03/27/2015    Current Outpatient Medications on File Prior to Visit  Medication Sig Dispense Refill  . aspirin EC 81 MG tablet Take 81 mg by mouth daily as needed.    . hydrochlorothiazide (HYDRODIURIL) 25 MG tablet Take 1 tablet (25 mg total) by mouth daily. 90 tablet 3  . levothyroxine (SYNTHROID, LEVOTHROID) 50 MCG tablet Take 1 tablet (50 mcg total) by mouth daily. 90 tablet 3  . Misc Natural Products (GLUCOSAMINE CHOND COMPLEX/MSM) TABS Take 1 tablet by mouth daily.    . Multiple Vitamin (MULTIVITAMIN) tablet Take 1 tablet by mouth daily.    . Cholecalciferol (VITAMIN D3) 5000 UNITS CAPS Take by mouth.    . Ferrous Sulfate (IRON) 325 (65 FE) MG TABS Take 1 tablet by mouth daily as needed.      No current facility-administered medications on file prior to visit.      Allergies  Allergen Reactions  . Lisinopril Swelling    Lip swelling    Past Medical History:  Diagnosis Date  . Arthritis   . Chicken pox   . Hypertension   . Thyroid disease     Past Surgical History:  Procedure Laterality Date  . HERNIA REPAIR     umbilical    Family History  Problem Relation Age of Onset  . Arthritis Mother   . Stroke Mother   . Arthritis Father   . Stroke Father   . Hypertension Father   . Thyroid disease Sister     Social History   Socioeconomic History  . Marital status: Divorced    Spouse name: Not on file  . Number of children: Not on file  . Years of education: Not on file  . Highest education level: Not on file  Social Needs  . Financial resource strain: Not on file  . Food insecurity - worry: Not on file  . Food insecurity - inability: Not on file  . Transportation needs - medical: Not on file  . Transportation needs - non-medical: Not on file  Occupational History  . Not on file  Tobacco Use  . Smoking status: Never Smoker  . Smokeless tobacco: Never Used  Substance and Sexual Activity  . Alcohol use: No  . Drug use: No  . Sexual activity: Not Currently    Partners: Male  Other Topics Concern  . Not on file  Social History Narrative   Exercise-- walk, treadmill 2x a week   The PMH, PSH, Social History, Family History, Medications, and allergies have been reviewed in [facility], and have been updated if relevant.   Review of Systems  Constitutional: Negative.   HENT: Negative.   Eyes: Negative.   Respiratory: Negative.   Cardiovascular: Negative.   Gastrointestinal: Negative.   Endocrine: Negative.   Genitourinary: Negative.   Musculoskeletal: Negative.   Skin: Negative.   Allergic/Immunologic: Negative.   Neurological: Negative.   Hematological: Negative.   Psychiatric/Behavioral: Negative.   All other systems reviewed and are negative.      Objective:    BP 126/84 (BP Location: Left Arm, Patient Position:  Sitting, Cuff Size: Normal)   Pulse (!) 54   Temp 98.9 F (37.2 C) (Oral)   Ht 5' 7.5" (1.715 m)   Wt 164 lb 3.2 oz (74.5 kg)   SpO2 99%   BMI 25.34 kg/m    Physical Exam   General:  Well-developed,well-nourished,in no acute distress; alert,appropriate and cooperative throughout examination Head:  normocephalic and atraumatic.   Eyes:  vision grossly intact, PERRL Ears:  R ear normal and L ear normal externally, TMs clear bilaterally Nose:  no external deformity.   Mouth:  good dentition.   Neck:  No deformities, masses, or tenderness noted. Lungs:  Normal respiratory effort, chest expands symmetrically. Lungs are clear to auscultation, no crackles or wheezes. Heart:  Normal rate and regular rhythm. S1 and S2 normal without gallop, murmur, click, rub or other extra sounds. Abdomen:  Bowel sounds positive,abdomen soft and non-tender without masses, organomegaly or hernias noted. Msk:  No deformity or scoliosis noted of thoracic or lumbar spine.   Extremities:  No clubbing, cyanosis, edema, or deformity noted with normal full range of motion of all joints.   Neurologic:  alert & oriented X3 and gait normal.   Skin:  Intact without suspicious lesions or rashes Psych:  Cognition and judgment appear intact. Alert and cooperative with normal attention span and concentration. No apparent delusions, illusions, hallucinations       Assessment & Plan:   Essential hypertension - Plan: Comprehensive metabolic panel  Hypothyroidism, unspecified type - Plan: TSH, T4, free, Lipid panel No Follow-up on file.

## 2017-02-24 NOTE — Patient Instructions (Signed)
Great to meet you. I will see you at your physical appointment.

## 2017-02-24 NOTE — Assessment & Plan Note (Addendum)
Clinically euthyroid.  Continue current dose of synthroid. Check thyroid function at physical in 2 weeks. The patient indicates understanding of these issues and agrees with the plan.

## 2017-02-24 NOTE — Assessment & Plan Note (Addendum)
Well controlled.  Continue current rx. Will check labs in  2 weeks at [facility]. The patient indicates understanding of these issues and agrees with the plan.

## 2017-03-02 ENCOUNTER — Encounter: Payer: Self-pay | Admitting: Family Medicine

## 2017-03-11 ENCOUNTER — Encounter: Payer: Self-pay | Admitting: Family Medicine

## 2017-03-11 ENCOUNTER — Other Ambulatory Visit (HOSPITAL_COMMUNITY)
Admission: RE | Admit: 2017-03-11 | Discharge: 2017-03-11 | Disposition: A | Discharge status: Home / Self Care | Payer: BLUE CROSS/BLUE SHIELD | Source: Ambulatory Visit | Attending: Family Medicine | Admitting: Family Medicine

## 2017-03-11 ENCOUNTER — Ambulatory Visit (INDEPENDENT_AMBULATORY_CARE_PROVIDER_SITE_OTHER): Payer: BLUE CROSS/BLUE SHIELD | Admitting: Family Medicine

## 2017-03-11 VITALS — BP 142/84 | HR 58 | Temp 98.5°F | Ht 67.5 in | Wt 165.4 lb

## 2017-03-11 DIAGNOSIS — Z79899 Other long term (current) drug therapy: Secondary | ICD-10-CM | POA: Insufficient documentation

## 2017-03-11 DIAGNOSIS — Z01419 Encounter for gynecological examination (general) (routine) without abnormal findings: Secondary | ICD-10-CM | POA: Diagnosis not present

## 2017-03-11 DIAGNOSIS — I10 Essential (primary) hypertension: Secondary | ICD-10-CM | POA: Diagnosis present

## 2017-03-11 DIAGNOSIS — Z79890 Hormone replacement therapy (postmenopausal): Secondary | ICD-10-CM | POA: Insufficient documentation

## 2017-03-11 DIAGNOSIS — M1990 Unspecified osteoarthritis, unspecified site: Secondary | ICD-10-CM | POA: Diagnosis not present

## 2017-03-11 DIAGNOSIS — E559 Vitamin D deficiency, unspecified: Secondary | ICD-10-CM | POA: Insufficient documentation

## 2017-03-11 DIAGNOSIS — E039 Hypothyroidism, unspecified: Secondary | ICD-10-CM

## 2017-03-11 DIAGNOSIS — E782 Mixed hyperlipidemia: Secondary | ICD-10-CM | POA: Diagnosis not present

## 2017-03-11 DIAGNOSIS — Z0000 Encounter for general adult medical examination without abnormal findings: Secondary | ICD-10-CM | POA: Insufficient documentation

## 2017-03-11 DIAGNOSIS — Z114 Encounter for screening for human immunodeficiency virus [HIV]: Secondary | ICD-10-CM | POA: Insufficient documentation

## 2017-03-11 LAB — CBC WITH DIFFERENTIAL/PLATELET
Basophils Absolute: 0 10*3/uL (ref 0.0–0.1)
Basophils Relative: 1.1 % (ref 0.0–3.0)
EOS PCT: 2.5 % (ref 0.0–5.0)
Eosinophils Absolute: 0.1 10*3/uL (ref 0.0–0.7)
HEMATOCRIT: 38.8 % (ref 36.0–46.0)
HEMOGLOBIN: 12.9 g/dL (ref 12.0–15.0)
Lymphocytes Relative: 37.7 % (ref 12.0–46.0)
Lymphs Abs: 1.3 10*3/uL (ref 0.7–4.0)
MCHC: 33.3 g/dL (ref 30.0–36.0)
MCV: 87.3 fl (ref 78.0–100.0)
Monocytes Absolute: 0.3 10*3/uL (ref 0.1–1.0)
Monocytes Relative: 9.1 % (ref 3.0–12.0)
Neutro Abs: 1.8 10*3/uL (ref 1.4–7.7)
Neutrophils Relative %: 49.6 % (ref 43.0–77.0)
Platelets: 226 10*3/uL (ref 150.0–400.0)
RBC: 4.44 Mil/uL (ref 3.87–5.11)
RDW: 13.9 % (ref 11.5–15.5)
WBC: 3.6 10*3/uL — AB (ref 4.0–10.5)

## 2017-03-11 LAB — COMPREHENSIVE METABOLIC PANEL
ALBUMIN: 4.1 g/dL (ref 3.5–5.2)
ALK PHOS: 72 U/L (ref 39–117)
ALT: 20 U/L (ref 0–35)
AST: 21 U/L (ref 0–37)
BUN: 15 mg/dL (ref 6–23)
CO2: 35 mEq/L — ABNORMAL HIGH (ref 19–32)
Calcium: 10.3 mg/dL (ref 8.4–10.5)
Chloride: 100 mEq/L (ref 96–112)
Creatinine, Ser: 1.16 mg/dL (ref 0.40–1.20)
GFR: 60.4 mL/min (ref >60.00)
Glucose, Bld: 88 mg/dL (ref 70–99)
POTASSIUM: 4 meq/L (ref 3.5–5.1)
Sodium: 141 mEq/L (ref 135–145)
TOTAL PROTEIN: 7 g/dL (ref 6.0–8.3)
Total Bilirubin: 0.8 mg/dL (ref 0.2–1.2)

## 2017-03-11 LAB — LIPID PANEL
CHOLESTEROL: 192 mg/dL (ref 0–200)
HDL: 47.4 mg/dL (ref >39.00)
LDL Cholesterol: 127 mg/dL — ABNORMAL HIGH (ref 0–99)
NonHDL: 144.81
Total CHOL/HDL Ratio: 4
Triglycerides: 89 mg/dL (ref 0.0–149.0)
VLDL: 17.8 mg/dL (ref 0.0–40.0)

## 2017-03-11 LAB — TSH: TSH: 1.86 u[IU]/mL (ref 0.35–4.50)

## 2017-03-11 LAB — T4, FREE: FREE T4: 1.05 ng/dL (ref 0.60–1.60)

## 2017-03-11 NOTE — Patient Instructions (Signed)
Great to see you. I will call you with your lab results from today and you can view them online.   

## 2017-03-11 NOTE — Progress Notes (Signed)
Subjective:   Patient ID: Lydia Joyce, female    DOB: May 18, 1950, 67 y.o.   MRN: [id number]  Lydia Joyce is a pleasant 67 y.o. year old female who presents to clinic today with Annual Exam (Patient is here today for a CPE with PAP.  She is currently fasting.  )  on 03/11/2017  HPI:  Established care with me earlier this month, 02/24/17. Note reviewed.  Health Maintenance  Topic Date Due  . MAMMOGRAM  10/27/2017  . COLONOSCOPY  11/04/2017  . PAP SMEAR  03/11/2018  . TETANUS/TDAP  06/02/2020  . INFLUENZA VACCINE  Completed  . Hepatitis C Screening  Completed  . HIV Screening  Completed    HTN- has been compliant with HCTZ 25 mg daily. Denies HA, blurred vision, CP, or LE edema. Experience angioedema with ACEI.  Lab Results  Component Value Date   CREATININE 1.4 (A) 10/03/2016     Hypothyroidism- Currently taking synthroid 50 mcg daily.  Denies any symptoms of hypo or hyperthyroidism.  Had labs done 6 months ago.  Lab Results  Component Value Date   TSH 1.19 10/03/2016   Current Outpatient Medications on File Prior to Visit  Medication Sig Dispense Refill  . aspirin EC 81 MG tablet Take 81 mg by mouth daily as needed.    . Cholecalciferol (VITAMIN D3) 5000 UNITS CAPS Take by mouth.    . Ferrous Sulfate (IRON) 325 (65 FE) MG TABS Take 1 tablet by mouth daily as needed.     . hydrochlorothiazide (HYDRODIURIL) 25 MG tablet Take 1 tablet (25 mg total) by mouth daily. 90 tablet 3  . levothyroxine (SYNTHROID, LEVOTHROID) 50 MCG tablet Take 1 tablet (50 mcg total) by mouth daily. 90 tablet 3  . Misc Natural Products (GLUCOSAMINE CHOND COMPLEX/MSM) TABS Take 1 tablet by mouth daily.    . Multiple Vitamin (MULTIVITAMIN) tablet Take 1 tablet by mouth daily.     No current facility-administered medications on file prior to visit.     Allergies  Allergen Reactions  . Lisinopril Swelling    Lip swelling    Past Medical History:  Diagnosis Date  . Arthritis   .  Chicken pox   . Hyperlipidemia, mixed   . Hypertension   . Hypothyroidism   . Thyroid disease   . Vitamin D deficiency     Past Surgical History:  Procedure Laterality Date  . HERNIA REPAIR     umbilical    Family History  Problem Relation Age of Onset  . Arthritis Mother   . Stroke Mother   . Arthritis Father   . Stroke Father   . Hypertension Father   . Thyroid disease Sister     Social History   Socioeconomic History  . Marital status: Divorced    Spouse name: Not on file  . Number of children: Not on file  . Years of education: Not on file  . Highest education level: Not on file  Social Needs  . Financial resource strain: Not on file  . Food insecurity - worry: Not on file  . Food insecurity - inability: Not on file  . Transportation needs - medical: Not on file  . Transportation needs - non-medical: Not on file  Occupational History  . Not on file  Tobacco Use  . Smoking status: Never Smoker  . Smokeless tobacco: Never Used  Substance and Sexual Activity  . Alcohol use: No  . Drug use: No  . Sexual activity: Not Currently  Partners: Male  Other Topics Concern  . Not on file  Social History Narrative   Exercise-- walk, treadmill 2x a week   The PMH, PSH, Social History, Family History, Medications, and allergies have been reviewed in [facility], and have been updated if relevant.  Review of Systems  Constitutional: Negative.   HENT: Negative.   Eyes: Negative.   Respiratory: Negative.   Cardiovascular: Negative.   Gastrointestinal: Negative.   Endocrine: Negative.   Genitourinary: Negative.   Musculoskeletal: Negative.   Skin: Negative.   Allergic/Immunologic: Negative.   Neurological: Negative.   Hematological: Negative.   Psychiatric/Behavioral: Negative.   All other systems reviewed and are negative.      Objective:    BP (!) 142/84 (BP Location: Left Arm, Patient Position: Sitting, Cuff Size: Normal)   Pulse (!) 58   Temp 98.5 F (36.9  C) (Oral)   Ht 5' 7.5" (1.715 m)   Wt 165 lb 6.4 oz (75 kg)   SpO2 99%   BMI 25.52 kg/m   BP Readings from Last 3 Encounters:  03/11/17 (!) 142/84  02/24/17 126/84  03/27/15 132/82    Physical Exam   General:  Well-developed,well-nourished,in no acute distress; alert,appropriate and cooperative throughout examination Head:  normocephalic and atraumatic.   Eyes:  vision grossly intact, PERRL Ears:  R ear normal and L ear normal externally, TMs clear bilaterally Nose:  no external deformity.   Mouth:  good dentition.   Neck:  No deformities, masses, or tenderness noted. Breasts:  No mass, nodules, thickening, tenderness, bulging, retraction, inflamation, nipple discharge or skin changes noted.   Lungs:  Normal respiratory effort, chest expands symmetrically. Lungs are clear to auscultation, no crackles or wheezes. Heart:  Normal rate and regular rhythm. S1 and S2 normal without gallop, murmur, click, rub or other extra sounds. Abdomen:  Bowel sounds positive,abdomen soft and non-tender without masses, organomegaly or hernias noted. Rectal:  no external abnormalities.   Genitalia:  Pelvic Exam:        External: normal female genitalia without lesions or masses        Vagina: normal without lesions or masses        Cervix: normal without lesions or masses        Adnexa: normal bimanual exam without masses or fullness        Uterus: normal by palpation        Pap smear: performed Msk:  No deformity or scoliosis noted of thoracic or lumbar spine.   Extremities:  No clubbing, cyanosis, edema, or deformity noted with normal full range of motion of all joints.   Neurologic:  alert & oriented X3 and gait normal.   Skin:  Intact without suspicious lesions or rashes Cervical Nodes:  No lymphadenopathy noted Axillary Nodes:  No palpable lymphadenopathy Psych:  Cognition and judgment appear intact. Alert and cooperative with normal attention span and concentration. No apparent delusions,  illusions, hallucinations      Assessment & Plan:   Well woman exam with routine gynecological exam - Plan: Cytology - PAP  Hypothyroidism, unspecified type - Plan: T4, free, Comprehensive metabolic panel, Lipid panel, TSH  Essential hypertension - Plan: CBC with Differential/Platelet, Comprehensive metabolic panel No Follow-up on file.

## 2017-03-11 NOTE — Assessment & Plan Note (Signed)
Continue current dose of synthroid.  Check labs today. 

## 2017-03-11 NOTE — Assessment & Plan Note (Signed)
Well controlled.  No changes made. 

## 2017-03-11 NOTE — Assessment & Plan Note (Addendum)
Reviewed preventive care protocols, scheduled due services, and updated immunizations Discussed nutrition, exercise, diet, and healthy lifestyle.  Pap smear done today. Mammogram, colonoscopy UTD.

## 2017-03-13 LAB — CYTOLOGY - PAP
Bacterial vaginitis: NEGATIVE
Candida vaginitis: NEGATIVE
DIAGNOSIS: NEGATIVE
HPV: NOT DETECTED

## 2017-03-31 ENCOUNTER — Telehealth: Payer: Self-pay

## 2017-03-31 ENCOUNTER — Telehealth: Payer: Self-pay | Admitting: Family Medicine

## 2017-03-31 ENCOUNTER — Other Ambulatory Visit: Payer: Self-pay | Admitting: *Deleted

## 2017-03-31 DIAGNOSIS — I10 Essential (primary) hypertension: Secondary | ICD-10-CM

## 2017-03-31 DIAGNOSIS — E039 Hypothyroidism, unspecified: Secondary | ICD-10-CM

## 2017-03-31 MED ORDER — HYDROCHLOROTHIAZIDE 25 MG PO TABS: 25.0000 mg | ORAL_TABLET | Freq: Every day | ORAL | 3 refills | Status: DC

## 2017-03-31 MED ORDER — LEVOTHYROXINE SODIUM 50 MCG PO TABS: 50.0000 ug | ORAL_TABLET | Freq: Every day | ORAL | 3 refills | Status: DC

## 2017-03-31 NOTE — Telephone Encounter (Signed)
Copied from [place]. Topic: Quick Communication - Rx Refill/Question >> Mar 31, 2017  9:05 AM Celedonio Savage L wrote: Medication: hydrochlorothiazide (HYDRODIURIL) 25 MG tablet   levothyroxine (SYNTHROID, LEVOTHROID) 50 MCG tablet     Has the patient contacted their pharmacy? Yes.     (Agent: If no, request that the patient contact the pharmacy for the refill.)   Preferred Pharmacy (with phone number or street name): [place], [place]. [phone] (Phone) [phone] (Fax)     Agent: Please be advised that RX refills may take up to 3 business days. We ask that you follow-up with your pharmacy.

## 2017-03-31 NOTE — Telephone Encounter (Signed)
TA-Pt wanted to make you aware that she is taking Biotin 10k mg OTC? I added as 10k mcg as I'm sure that is what it is/plz advise if you want me to do tell her anything/thx dmf  Copied from [place] [id number]. Topic: General - Other >> Mar 31, 2017  9:07 AM Yvette Rack wrote: Reason for CRM: patient calling to let Dr Deborra Medina know that she takes Biotin 10,000mg  OTC please add to her medicines

## 2017-03-31 NOTE — Telephone Encounter (Signed)
Pt would 90 day refill on both medicines

## 2017-03-31 NOTE — Telephone Encounter (Signed)
Rx refilled per protocol- LOV: 2/27

## 2017-05-25 ENCOUNTER — Telehealth: Payer: Self-pay | Admitting: Family Medicine

## 2017-05-25 NOTE — Telephone Encounter (Signed)
Copied from [place]. Topic: Bill or Statement - Patient/Guarantor Inquiry >> 2017-04-24  6:02 PM Valla Leaver wrote: Patient name/MRN/Acct #: [id number] DOS: 03/11/2017 Details of issue or inquiry: Anya with [employer] calling because the diagnosis is incorrect on the coding and it needs to be resubmitted. It was a screening for cervical cancer which is preventative. Please follow up with patient to notify her when this has been resolved..   Will automatically be routed to [facility] pool.  >> May 11, 2017 11:19 AM Jerene Dilling H wrote: Dx code linked to 80050 & 80061 may not be appropriate.. Please query the provider to see if it can be changed to a screening if it is applicable. 84439 appears to be correct.   Centralized coding is unable to correct lab diagnosis errors.  Thank you, Amy >> May 22, 2017  4:25 PM Margot Ables wrote: Pt calling stating that she is still receiving a past due bill regarding OV 03/11/17. She said that she did not get a "candida" test. She said she only wanted pap smear and preventative visit. She said [id number] candida DNA and [phone] gardener pro and [phone] det agent DNA AMP were not billed as preventative. Pt states she did not authorize anything other than preventative. She states she declined STI testing as well.  Pt is requesting a call back. She talked to insurance and to [facility] central billing and she was told she needs to talk to the office to correct billing codes.  Pt states [employer] called 24-Apr-2017 and was told this would be resolved w/in 2 weeks.  Call back [phone]. >> May 22, 2017  4:25 PM Margot Ables wrote: Pt is asking for call from Dr. Deborra Medina as well. >> May 25, 2017  2:14 PM Carolyn Stare wrote:   Pt is still waiting on a call back about her bill that she is waiting to hear back from .please return pt call   >> May 25, 2017  4:01 PM [place], Almedia Balls wrote: Spoke with patient to apologize and to make patient aware  that we will get her account credited of the test that was ordered. Made aware that this will not take place right away but our office will take care of it. Patient was very pleased with this outcome.

## 2017-10-15 ENCOUNTER — Other Ambulatory Visit: Payer: Self-pay

## 2017-10-15 DIAGNOSIS — R7989 Other specified abnormal findings of blood chemistry: Secondary | ICD-10-CM

## 2017-10-15 NOTE — Progress Notes (Signed)
PEC-Future order created/pt does not need OV for Monday she only needs a lab visit to repeat CBC/if pt calls back please advise of this and if she is in agreement then cancel appt with Dr. Deborra Medina and schedule a lab visit instead/thx dmf

## 2017-10-19 ENCOUNTER — Ambulatory Visit: Payer: Self-pay | Admitting: Family Medicine

## 2017-10-19 ENCOUNTER — Other Ambulatory Visit (INDEPENDENT_AMBULATORY_CARE_PROVIDER_SITE_OTHER): Payer: Medicare HMO

## 2017-10-19 ENCOUNTER — Telehealth: Payer: Self-pay

## 2017-10-19 DIAGNOSIS — R7989 Other specified abnormal findings of blood chemistry: Secondary | ICD-10-CM | POA: Diagnosis not present

## 2017-10-19 LAB — CBC WITH DIFFERENTIAL/PLATELET
BASOS ABS: 0 10*3/uL (ref 0.0–0.1)
BASOS PCT: 0.5 % (ref 0.0–3.0)
EOS ABS: 0.2 10*3/uL (ref 0.0–0.7)
Eosinophils Relative: 3.1 % (ref 0.0–5.0)
HEMATOCRIT: 36.8 % (ref 36.0–46.0)
Hemoglobin: 12.4 g/dL (ref 12.0–15.0)
LYMPHS ABS: 2 10*3/uL (ref 0.7–4.0)
LYMPHS PCT: 39.2 % (ref 12.0–46.0)
MCHC: 33.7 g/dL (ref 30.0–36.0)
MCV: 85.2 fl (ref 78.0–100.0)
MONO ABS: 0.5 10*3/uL (ref 0.1–1.0)
Monocytes Relative: 10.2 % (ref 3.0–12.0)
NEUTROS ABS: 2.3 10*3/uL (ref 1.4–7.7)
NEUTROS PCT: 47 % (ref 43.0–77.0)
PLATELETS: 234 10*3/uL (ref 150.0–400.0)
RBC: 4.32 Mil/uL (ref 3.87–5.11)
RDW: 14.4 % (ref 11.5–15.5)
WBC: 5 10*3/uL (ref 4.0–10.5)

## 2017-10-19 NOTE — Telephone Encounter (Signed)
Pt came in this morning for labs. She wanted it noted in her chart she does not want phone calls for her lab results. Requesting results be mailed to her at the address on file.

## 2017-10-27 DIAGNOSIS — R69 Illness, unspecified: Secondary | ICD-10-CM | POA: Diagnosis not present

## 2017-11-04 DIAGNOSIS — R69 Illness, unspecified: Secondary | ICD-10-CM | POA: Diagnosis not present

## 2017-11-10 DIAGNOSIS — R69 Illness, unspecified: Secondary | ICD-10-CM | POA: Diagnosis not present

## 2017-11-26 ENCOUNTER — Ambulatory Visit (INDEPENDENT_AMBULATORY_CARE_PROVIDER_SITE_OTHER): Payer: Medicare HMO | Admitting: Behavioral Health

## 2017-11-26 DIAGNOSIS — Z23 Encounter for immunization: Secondary | ICD-10-CM | POA: Diagnosis not present

## 2017-11-26 NOTE — Progress Notes (Signed)
Patient came in clinic today for Influenza vaccination. IM injection was given in the left deltoid. Patient tolerated the injection well. No s/s of a reaction were noted prior to patient leaving the nurse visit.

## 2017-12-23 DIAGNOSIS — Z0100 Encounter for examination of eyes and vision without abnormal findings: Secondary | ICD-10-CM | POA: Diagnosis not present

## 2018-02-24 ENCOUNTER — Encounter: Payer: Self-pay | Admitting: Family Medicine

## 2018-02-24 ENCOUNTER — Ambulatory Visit (INDEPENDENT_AMBULATORY_CARE_PROVIDER_SITE_OTHER): Payer: Medicare HMO | Admitting: Family Medicine

## 2018-02-24 VITALS — BP 124/70 | HR 62 | Temp 98.7°F | Ht 68.0 in | Wt 157.4 lb

## 2018-02-24 DIAGNOSIS — J069 Acute upper respiratory infection, unspecified: Secondary | ICD-10-CM

## 2018-02-24 DIAGNOSIS — R053 Chronic cough: Secondary | ICD-10-CM

## 2018-02-24 DIAGNOSIS — R05 Cough: Secondary | ICD-10-CM

## 2018-02-24 DIAGNOSIS — R070 Pain in throat: Secondary | ICD-10-CM

## 2018-02-24 HISTORY — DX: Chronic cough: R05.3

## 2018-02-24 LAB — POCT RAPID STREP A (OFFICE): Rapid Strep A Screen: NEGATIVE

## 2018-02-24 MED ORDER — BENZONATATE 200 MG PO CAPS: 200.0000 mg | ORAL_CAPSULE | Freq: Two times a day (BID) | ORAL | 0 refills | Status: DC | PRN

## 2018-02-24 MED ORDER — DM-GUAIFENESIN ER 30-600 MG PO TB12: 1.0000 | ORAL_TABLET | Freq: Two times a day (BID) | ORAL | 0 refills | Status: DC | PRN

## 2018-02-24 NOTE — Patient Instructions (Addendum)
Great to see you.  I am so sorry for your loss.  This is likely a virus.  Take Mucinex DM twice daily for the next week - 10 day or until your symptoms improve.  Drink a lot of water while taking this.  Tessalon as needed for cough.  Please keep me updated.

## 2018-02-24 NOTE — Assessment & Plan Note (Signed)
Likely viral, several infections she is picking up from her grandchildren. Symptomatic therapy suggested: push fluids, rest and return office visit prn if symptoms persist or worsen. Lack of antibiotic effectiveness discussed with her. Call or return to clinic prn if these symptoms worsen or fail to improve as anticipated.  Mucinex DM twice daily x 5-7 days.  Push fluids.  Tessalon as needed for cough.

## 2018-02-24 NOTE — Addendum Note (Signed)
Addended by: Marrion Coy on: 02/24/2018 07:56 AM   Modules accepted: Orders

## 2018-02-24 NOTE — Progress Notes (Signed)
Subjective:   Patient ID: Lydia Joyce, female    DOB: 08/12/1950, 68 y.o.   MRN: [id number]  Lydia Joyce is a pleasant 68 y.o. year old female who presents to clinic today with Cough (She has had Sx since december.)  on 02/24/2018  HPI:  URI symptoms- has had a cough with URI symptoms intermittently for 2 months since she has been taking care of her small grand children. Cough has seemed to linger but the other symptoms improve with each infection. Over the past week, the cough has become productive.  No fevers. No chills.  Does not keep her up at night.  +PND but no sinus pressure.  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  Current Outpatient Medications on File Prior to Visit  Medication Sig Dispense Refill  . aspirin EC 81 MG tablet Take 81 mg by mouth daily as needed.    . Biotin 10000 MCG TABS Take 1 tablet by mouth daily.    . Cholecalciferol (VITAMIN D3) 5000 UNITS CAPS Take by mouth.    . Ferrous Sulfate (IRON) 325 (65 FE) MG TABS Take 1 tablet by mouth daily as needed.     . hydrochlorothiazide (HYDRODIURIL) 25 MG tablet Take 1 tablet (25 mg total) by mouth daily. 90 tablet 3  . levothyroxine (SYNTHROID, LEVOTHROID) 50 MCG tablet Take 1 tablet (50 mcg total) by mouth daily. 90 tablet 3  . Misc Natural Products (GLUCOSAMINE CHOND COMPLEX/MSM) TABS Take 1 tablet by mouth daily.    . Multiple Vitamin (MULTIVITAMIN) tablet Take 1 tablet by mouth daily.     No current facility-administered medications on file prior to visit.     Allergies  Allergen Reactions  . Lisinopril Swelling    Lip swelling    Past Medical History:  Diagnosis Date  . Arthritis   . Chicken pox   . Hyperlipidemia, mixed   . Hypertension   . Hypothyroidism   . Thyroid disease   . Vitamin D deficiency     Past Surgical History:  Procedure Laterality Date  . HERNIA REPAIR     umbilical    Family History  Problem Relation Age of Onset  .  Arthritis Mother   . Stroke Mother   . Arthritis Father   . Stroke Father   . Hypertension Father   . Thyroid disease Sister     Social History   Socioeconomic History  . Marital status: Divorced    Spouse name: Not on file  . Number of children: Not on file  . Years of education: Not on file  . Highest education level: Not on file  Occupational History  . Not on file  Social Needs  . Financial resource strain: Not on file  . Food insecurity:    Worry: Not on file    Inability: Not on file  . Transportation needs:    Medical: Not on file    Non-medical: Not on file  Tobacco Use  . Smoking status: Never Smoker  . Smokeless tobacco: Never Used  Substance and Sexual Activity  . Alcohol use: No  . Drug use: No  . Sexual activity: Not Currently    Partners: Male  Lifestyle  . Physical activity:    Days per week: Not on file    Minutes per session: Not on file  . Stress: Not on file  Relationships  . Social connections:    Talks on phone: Not  on file    Gets together: Not on file    Attends religious service: Not on file    Active member of club or organization: Not on file    Attends meetings of clubs or organizations: Not on file    Relationship status: Not on file  . Intimate partner violence:    Fear of current or ex partner: Not on file    Emotionally abused: Not on file    Physically abused: Not on file    Forced sexual activity: Not on file  Other Topics Concern  . Not on file  Social History Narrative   Exercise-- walk, treadmill 2x a week   The PMH, PSH, Social History, Family History, Medications, and allergies have been reviewed in [facility], and have been updated if relevant.   Review of Systems  Constitutional: Negative.   HENT: Positive for congestion, postnasal drip and sore throat. Negative for sinus pressure and sinus pain.   Respiratory: Positive for cough. Negative for apnea, choking, shortness of breath, wheezing and stridor.   Cardiovascular:  Negative.   Gastrointestinal: Negative.   Endocrine: Negative.   Genitourinary: Negative.   Musculoskeletal: Negative.   Allergic/Immunologic: Negative.   Neurological: Negative.   Hematological: Negative.   Psychiatric/Behavioral: Negative.   All other systems reviewed and are negative.      Objective:    BP 124/70 (BP Location: Left Arm, Patient Position: Sitting, Cuff Size: Normal)   Pulse 62   Temp 98.7 F (37.1 C) (Oral)   Ht 5\' 8"  (1.727 m)   Wt 157 lb 6.4 oz (71.4 kg)   SpO2 97%   BMI 23.93 kg/m    Physical Exam Vitals signs and nursing note reviewed.  Constitutional:      Appearance: Normal appearance.  HENT:     Head: Normocephalic and atraumatic.     Right Ear: Hearing and tympanic membrane normal.     Left Ear: Hearing and tympanic membrane normal.     Nose:     Right Sinus: No maxillary sinus tenderness or frontal sinus tenderness.     Left Sinus: No frontal sinus tenderness.  Cardiovascular:     Pulses: Normal pulses.     Heart sounds: Normal heart sounds.  Pulmonary:     Effort: No respiratory distress.     Breath sounds: Normal breath sounds. No stridor.  Musculoskeletal: Normal range of motion.  Skin:    General: Skin is warm and dry.  Neurological:     General: No focal deficit present.     Mental Status: She is alert.  Psychiatric:        Mood and Affect: Mood normal.        Behavior: Behavior normal.        Thought Content: Thought content normal.        Judgment: Judgment normal.           Assessment & Plan:   Persistent cough for 3 weeks or longer No follow-ups on file.

## 2018-03-15 ENCOUNTER — Other Ambulatory Visit: Payer: Self-pay | Admitting: Family Medicine

## 2018-03-15 DIAGNOSIS — I10 Essential (primary) hypertension: Secondary | ICD-10-CM

## 2018-03-15 DIAGNOSIS — E039 Hypothyroidism, unspecified: Secondary | ICD-10-CM

## 2018-03-15 NOTE — Telephone Encounter (Signed)
Copied from [place] [id number]. Topic: Quick Communication - Rx Refill/Question >> Mar 15, 2018  8:41 AM Lionel December wrote: Medication:  hydrochlorothiazide (HYDRODIURIL) 25 MG tablet   levothyroxine (SYNTHROID, LEVOTHROID) 50 MCG tablet     Has the patient contacted their pharmacy? Yes.   (Agent: If no, request that the patient contact the pharmacy for the refill.) (Agent: If yes, when and what did the pharmacy advise?)  Preferred Pharmacy (with phone number or street name): [place], [place]. [phone] (Phone) [phone] (Fax)    Agent: Please be advised that RX refills may take up to 3 business days. We ask that you follow-up with your pharmacy.

## 2018-06-30 ENCOUNTER — Other Ambulatory Visit: Payer: Self-pay | Admitting: Family Medicine

## 2018-06-30 DIAGNOSIS — I10 Essential (primary) hypertension: Secondary | ICD-10-CM

## 2018-06-30 DIAGNOSIS — E039 Hypothyroidism, unspecified: Secondary | ICD-10-CM

## 2018-08-17 DIAGNOSIS — R69 Illness, unspecified: Secondary | ICD-10-CM | POA: Diagnosis not present

## 2018-08-31 DIAGNOSIS — Z1231 Encounter for screening mammogram for malignant neoplasm of breast: Secondary | ICD-10-CM | POA: Diagnosis not present

## 2018-09-07 ENCOUNTER — Encounter: Payer: Self-pay | Admitting: Family Medicine

## 2018-09-07 DIAGNOSIS — N6322 Unspecified lump in the left breast, upper inner quadrant: Secondary | ICD-10-CM | POA: Diagnosis not present

## 2018-09-07 DIAGNOSIS — R922 Inconclusive mammogram: Secondary | ICD-10-CM | POA: Diagnosis not present

## 2018-09-07 DIAGNOSIS — N6325 Unspecified lump in the left breast, overlapping quadrants: Secondary | ICD-10-CM | POA: Diagnosis not present

## 2018-09-07 LAB — HM MAMMOGRAPHY

## 2018-09-08 ENCOUNTER — Encounter: Payer: Self-pay | Admitting: Family Medicine

## 2018-09-13 ENCOUNTER — Other Ambulatory Visit: Payer: Self-pay | Admitting: Radiology

## 2018-09-13 DIAGNOSIS — D242 Benign neoplasm of left breast: Secondary | ICD-10-CM | POA: Diagnosis not present

## 2018-09-13 DIAGNOSIS — N6325 Unspecified lump in the left breast, overlapping quadrants: Secondary | ICD-10-CM | POA: Diagnosis not present

## 2018-10-03 DIAGNOSIS — N6012 Diffuse cystic mastopathy of left breast: Secondary | ICD-10-CM | POA: Insufficient documentation

## 2018-10-03 DIAGNOSIS — E559 Vitamin D deficiency, unspecified: Secondary | ICD-10-CM | POA: Insufficient documentation

## 2018-10-03 DIAGNOSIS — E782 Mixed hyperlipidemia: Secondary | ICD-10-CM | POA: Insufficient documentation

## 2018-10-03 NOTE — Progress Notes (Signed)
Subjective:   Patient ID: Lydia Joyce, female    DOB: Jan 16, 1950, 68 y.o.   MRN: [id number]  Lydia Joyce is a pleasant 68 y.o. year old female who presents to clinic today with Annual Exam and Follow-up (chonic medical conditions. She is currently fasting today.  She would like to get the flu shot. She agrees to [employer].)  on 10/04/2018  HPI:  Established care with me in 02/2017.   Note reviewed.  Last colonoscopy 11/05/2007?  cologuard? Health Maintenance  Topic Date Due  . INFLUENZA VACCINE  08/14/2018  . PAP SMEAR-Modifier  10/11/2018 (Originally 03/11/2018)  . COLONOSCOPY  02/25/2019 (Originally 11/04/2017)  . PNA vac Low Risk Adult (1 of 2 - PCV13) 02/25/2019 (Originally 09/19/2017)  . MAMMOGRAM  09/07/2019  . TETANUS/TDAP  06/02/2020  . DEXA SCAN  Completed  . Hepatitis C Screening  Completed   Has GYN- Dr. Roland Rack who she has an appointment with on 10/14/18. Last pap smear done by me on 02/19/17.  Mammogram from 09/07/18- was category 4/suspicious due to left breast mass done at [facility].  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 This was ordered by Dr. Luan Pulling. She has several questions for me. She was told that she should have the mass removed, per pt.   HTN- has been compliant with HCTZ 25 mg daily. Denies HA, blurred vision, CP, or LE edema. Experienced angioedema with ACEI. Walks and does some sort of aerobics everyday.  Lab Results  Component Value Date   CREATININE 1.16 03/11/2017    H/o iron deficiency anemia- she does take ferrous sulfate (325/65) DAILY.  Lab Results  Component Value Date   WBC 5.0 10/19/2017   HGB 12.4 10/19/2017   HCT 36.8 10/19/2017   MCV 85.2 10/19/2017   PLT 234.0 10/19/2017   Lab Results  Component Value Date   FERRITIN 85.6 03/27/2015     HLD-has been diet controlled but asvd risk score is higher than I expected.. Lab Results   Component Value Date   CHOL 192 03/11/2017   HDL 47.40 03/11/2017   LDLCALC 127 (H) 03/11/2017   TRIG 89.0 03/11/2017   CHOLHDL 4 03/11/2017   The 10-year ASCVD risk score Mikey Bussing DC Jr., et al., 2013) is: 10.6%   Values used to calculate the score:     Age: 68 years     Sex: Female     Is Non-Hispanic African American: Yes     Diabetic: No     Tobacco smoker: No     Systolic Blood Pressure: Q000111Q mmHg     Is BP treated: Yes     HDL Cholesterol: 47.4 mg/dL     Total Cholesterol: 192 mg/dL  Hypothyroidism- Currently taking synthroid 50 mcg daily.  Denies any symptoms of hypo or hyperthyroidism.  Due for labs.  Lab Results  Component Value Date   TSH 1.86 03/11/2017     Vitamin D deficiency-  Currently taking 5000 IU daily.  Lab Results  Component Value Date   VD25OH 19.87 10/03/2016   VD25OH 22.05 02/29/2016      Lab Results  Component Value Date   TSH 1.86 03/11/2017   Current Outpatient Medications on File Prior to Visit  Medication Sig Dispense Refill  . Ascorbic Acid (VITAMIN C) 1000 MG tablet Take 1,000 mg by mouth daily.    Marland Kitchen aspirin EC 81 MG tablet Take 81 mg by mouth  daily as needed.    . Biotin 10000 MCG TABS Take 1 tablet by mouth daily.    . Cholecalciferol (VITAMIN D3) 5000 UNITS CAPS Take by mouth.    . Ferrous Sulfate (IRON) 325 (65 FE) MG TABS Take 1 tablet by mouth daily as needed.     . hydrochlorothiazide (HYDRODIURIL) 25 MG tablet Take 1 tablet by mouth once daily 90 tablet 0  . levothyroxine (SYNTHROID) 50 MCG tablet Take 1 tablet by mouth once daily 90 tablet 0  . Misc Natural Products (GLUCOSAMINE CHOND COMPLEX/MSM) TABS Take 1 tablet by mouth daily.    . Multiple Vitamin (MULTIVITAMIN) tablet Take 1 tablet by mouth daily.    . Zinc Acetate, Oral, (ZINC ACETATE PO) Take 5 mg by mouth daily.     No current facility-administered medications on file prior to visit.     Allergies  Allergen Reactions  . Lisinopril Swelling    Lip swelling     Past Medical History:  Diagnosis Date  . Arthritis   . Chicken pox   . Hyperlipidemia, mixed   . Hypertension   . Hypothyroidism   . Persistent cough for 3 weeks or longer 02/24/2018  . Thyroid disease   . Vitamin D deficiency     Past Surgical History:  Procedure Laterality Date  . HERNIA REPAIR     umbilical    Family History  Problem Relation Age of Onset  . Arthritis Mother   . Stroke Mother   . Arthritis Father   . Stroke Father   . Hypertension Father   . Thyroid disease Sister     Social History   Socioeconomic History  . Marital status: Divorced    Spouse name: Not on file  . Number of children: Not on file  . Years of education: Not on file  . Highest education level: Not on file  Occupational History  . Not on file  Social Needs  . Financial resource strain: Not on file  . Food insecurity    Worry: Not on file    Inability: Not on file  . Transportation needs    Medical: Not on file    Non-medical: Not on file  Tobacco Use  . Smoking status: Never Smoker  . Smokeless tobacco: Never Used  Substance and Sexual Activity  . Alcohol use: No  . Drug use: No  . Sexual activity: Not Currently    Partners: Male  Lifestyle  . Physical activity    Days per week: Not on file    Minutes per session: Not on file  . Stress: Not on file  Relationships  . Social Herbalist on phone: Not on file    Gets together: Not on file    Attends religious service: Not on file    Active member of club or organization: Not on file    Attends meetings of clubs or organizations: Not on file    Relationship status: Not on file  . Intimate partner violence    Fear of current or ex partner: Not on file    Emotionally abused: Not on file    Physically abused: Not on file    Forced sexual activity: Not on file  Other Topics Concern  . Not on file  Social History Narrative   Exercise-- walk, treadmill 2x a week   The PMH, PSH, Social History, Family History,  Medications, and allergies have been reviewed in [facility], and have been updated if relevant.  Review of Systems  Constitutional: Negative.   HENT: Negative.   Eyes: Negative.   Respiratory: Negative.   Cardiovascular: Negative.   Gastrointestinal: Negative.   Endocrine: Negative.   Genitourinary: Negative.   Musculoskeletal: Negative.   Skin: Negative.   Allergic/Immunologic: Negative.   Neurological: Negative.   Hematological: Negative.   Psychiatric/Behavioral: Negative.   All other systems reviewed and are negative.      Objective:    BP 134/76 (BP Location: Left Arm, Patient Position: Sitting, Cuff Size: Normal)   Pulse (!) 52   Temp 98.6 F (37 C) (Oral)   Ht 5' 7.75" (1.721 m)   Wt 163 lb 3.2 oz (74 kg)   SpO2 99%   BMI 25.00 kg/m   BP Readings from Last 3 Encounters:  10/04/18 134/76  02/24/18 124/70  03/11/17 (!) 142/84   Wt Readings from Last 3 Encounters:  10/04/18 163 lb 3.2 oz (74 kg)  02/24/18 157 lb 6.4 oz (71.4 kg)  03/11/17 165 lb 6.4 oz (75 kg)    Physical Exam  Constitutional: She is oriented to person, place, and time. She appears well-developed and well-nourished.  HENT:  Head: Normocephalic and atraumatic.  Eyes: Pupils are equal, round, and reactive to light. Conjunctivae are normal.  Neck: Neck supple.  Cardiovascular: Normal rate and regular rhythm.  Pulmonary/Chest: Effort normal and breath sounds normal. No respiratory distress.  Abdominal: Soft. Bowel sounds are normal. She exhibits no distension and no mass. There is no abdominal tenderness. There is no rebound and no guarding.  Musculoskeletal: Normal range of motion.        General: No edema.  Neurological: She is alert and oriented to person, place, and time. No cranial nerve deficit.  Skin: Skin is warm and dry. She is not diaphoretic.  Psychiatric: She has a normal mood and affect. Her behavior is normal. Judgment and thought content normal.  Nursing note and vitals reviewed.          Assessment & Plan:   Well woman exam without gynecological exam  Ductal papillomatosis of left breast  Hypothyroidism, unspecified type - Plan: TSH, T4, free  Essential hypertension  Vitamin D deficiency - Plan: Vitamin D (25 hydroxy)  Hyperlipidemia, mixed - Plan: Lipid panel, Comprehensive metabolic panel, CBC with Differential/Platelet  Other iron deficiency anemia - Plan: Ferritin No follow-ups on file.

## 2018-10-04 ENCOUNTER — Other Ambulatory Visit: Payer: Self-pay

## 2018-10-04 ENCOUNTER — Ambulatory Visit (INDEPENDENT_AMBULATORY_CARE_PROVIDER_SITE_OTHER): Payer: Medicare HMO | Admitting: Family Medicine

## 2018-10-04 VITALS — BP 134/76 | HR 52 | Temp 98.6°F | Ht 67.75 in | Wt 163.2 lb

## 2018-10-04 DIAGNOSIS — E039 Hypothyroidism, unspecified: Secondary | ICD-10-CM

## 2018-10-04 DIAGNOSIS — I10 Essential (primary) hypertension: Secondary | ICD-10-CM | POA: Diagnosis not present

## 2018-10-04 DIAGNOSIS — Z23 Encounter for immunization: Secondary | ICD-10-CM | POA: Diagnosis not present

## 2018-10-04 DIAGNOSIS — Z0000 Encounter for general adult medical examination without abnormal findings: Secondary | ICD-10-CM

## 2018-10-04 DIAGNOSIS — E559 Vitamin D deficiency, unspecified: Secondary | ICD-10-CM

## 2018-10-04 DIAGNOSIS — E782 Mixed hyperlipidemia: Secondary | ICD-10-CM

## 2018-10-04 DIAGNOSIS — N6012 Diffuse cystic mastopathy of left breast: Secondary | ICD-10-CM | POA: Diagnosis not present

## 2018-10-04 DIAGNOSIS — D509 Iron deficiency anemia, unspecified: Secondary | ICD-10-CM | POA: Insufficient documentation

## 2018-10-04 DIAGNOSIS — D508 Other iron deficiency anemias: Secondary | ICD-10-CM | POA: Diagnosis not present

## 2018-10-04 LAB — COMPREHENSIVE METABOLIC PANEL WITH GFR
ALT: 19 U/L (ref 0–35)
AST: 22 U/L (ref 0–37)
Albumin: 4.2 g/dL (ref 3.5–5.2)
Alkaline Phosphatase: 64 U/L (ref 39–117)
BUN: 13 mg/dL (ref 6–23)
CO2: 34 meq/L — ABNORMAL HIGH (ref 19–32)
Calcium: 10.1 mg/dL (ref 8.4–10.5)
Chloride: 98 meq/L (ref 96–112)
Creatinine, Ser: 1.11 mg/dL (ref 0.40–1.20)
GFR: 59.5 mL/min — ABNORMAL LOW
Glucose, Bld: 88 mg/dL (ref 70–99)
Potassium: 3.5 meq/L (ref 3.5–5.1)
Sodium: 138 meq/L (ref 135–145)
Total Bilirubin: 0.9 mg/dL (ref 0.2–1.2)
Total Protein: 7.2 g/dL (ref 6.0–8.3)

## 2018-10-04 LAB — CBC WITH DIFFERENTIAL/PLATELET
Basophils Absolute: 0 10*3/uL (ref 0.0–0.1)
Basophils Relative: 0.8 % (ref 0.0–3.0)
Eosinophils Absolute: 0.1 10*3/uL (ref 0.0–0.7)
Eosinophils Relative: 1.8 % (ref 0.0–5.0)
HCT: 37.9 % (ref 36.0–46.0)
Hemoglobin: 12.8 g/dL (ref 12.0–15.0)
Lymphocytes Relative: 32.4 % (ref 12.0–46.0)
Lymphs Abs: 1.3 10*3/uL (ref 0.7–4.0)
MCHC: 33.9 g/dL (ref 30.0–36.0)
MCV: 86.9 fl (ref 78.0–100.0)
Monocytes Absolute: 0.4 10*3/uL (ref 0.1–1.0)
Monocytes Relative: 9.1 % (ref 3.0–12.0)
Neutro Abs: 2.2 10*3/uL (ref 1.4–7.7)
Neutrophils Relative %: 55.9 % (ref 43.0–77.0)
Platelets: 243 10*3/uL (ref 150.0–400.0)
RBC: 4.36 Mil/uL (ref 3.87–5.11)
RDW: 14.1 % (ref 11.5–15.5)
WBC: 3.9 10*3/uL — ABNORMAL LOW (ref 4.0–10.5)

## 2018-10-04 LAB — TSH: TSH: 0.96 u[IU]/mL (ref 0.35–4.50)

## 2018-10-04 LAB — LIPID PANEL
Cholesterol: 175 mg/dL (ref 0–200)
HDL: 44.7 mg/dL
LDL Cholesterol: 114 mg/dL — ABNORMAL HIGH (ref 0–99)
NonHDL: 130.52
Total CHOL/HDL Ratio: 4
Triglycerides: 82 mg/dL (ref 0.0–149.0)
VLDL: 16.4 mg/dL (ref 0.0–40.0)

## 2018-10-04 LAB — T4, FREE: Free T4: 1.09 ng/dL (ref 0.60–1.60)

## 2018-10-04 NOTE — Assessment & Plan Note (Signed)
Repeat labs today

## 2018-10-04 NOTE — Assessment & Plan Note (Addendum)
Well controlled, no changes to meds. Encouraged heart healthy diet such as the DASH diet and exercise as tolerated.   Orders Placed This Encounter  Procedures  . Lipid panel  . Comprehensive metabolic panel  . CBC with Differential/Platelet  . TSH  . T4, free  . Vitamin D (25 hydroxy)  . Ferritin

## 2018-10-04 NOTE — Assessment & Plan Note (Addendum)
Total time spent with patient was 40 minutes, with 15 minutes spent specifically on problem visit concerning ductal papilloma, HTN, hypothyroidism, ascvd risk calculator.  I explained that I am not an oncologist or gynecologist (she is seeing Dr. Elly Modena on 10/1).  These are characterized by papillary cells that going to the lumen from the wall of the cyst. They're not concerning for malignancy but they can occasionally harbor either atypical ductal hyperplasia or DCIS. Because of this many surgeons recommend removal of these lesions. Retrospective studies have revealed that 15% of them could be upgraded to malignancy following surgical resection. It is not very clear which papillomas have to be removed and which can be left behind.

## 2018-10-04 NOTE — Patient Instructions (Addendum)
Great to see you!!! I will call you with your lab results from today and you can view them online.   Cologuard will be mailed to your house.  These are characterized by papillary cells that going to the lumen from the wall of the cyst. They're not concerning for malignancy but they can occasionally harbor either atypical ductal hyperplasia or DCIS. Because of this many surgeons recommend removal of these lesions. Retrospective studies have revealed that 15% of them could be upgraded to malignancy following surgical resection. It is not very clear which papillomas have to be removed and which can be left behind.

## 2018-10-04 NOTE — Assessment & Plan Note (Signed)
Reviewed preventive care protocols, scheduled due services, and updated immunizations Discussed nutrition, exercise, diet, and healthy lifestyle.  Influenza vaccine given today.  She agrees to cologuard as well.

## 2018-10-04 NOTE — Assessment & Plan Note (Signed)
Clinically euthyroid. Due for labs today. 

## 2018-10-04 NOTE — Assessment & Plan Note (Addendum)
The 10-year ASCVD risk score Mikey Bussing DC Brooke Bonito., et al., 2013) is: 10.6%   Values used to calculate the score:     Age: 68 years     Sex: Female     Is Non-Hispanic African American: Yes     Diabetic: No     Tobacco smoker: No     Systolic Blood Pressure: Q000111Q mmHg     Is BP treated: Yes     HDL Cholesterol: 47.4 mg/dL     Total Cholesterol: 192 mg/dL  ascvd score is higher than I expected.  We agreed to recheck labs today and reassess as she hsa been walking more.

## 2018-10-04 NOTE — Assessment & Plan Note (Signed)
Check labs today.  She is taking iron.

## 2018-10-05 LAB — VITAMIN D 25 HYDROXY (VIT D DEFICIENCY, FRACTURES): VITD: 24.32 ng/mL — ABNORMAL LOW (ref 30.00–100.00)

## 2018-10-05 LAB — FERRITIN: Ferritin: 96.8 ng/mL (ref 10.0–291.0)

## 2018-10-12 ENCOUNTER — Other Ambulatory Visit: Payer: Self-pay | Admitting: Family Medicine

## 2018-10-12 DIAGNOSIS — E039 Hypothyroidism, unspecified: Secondary | ICD-10-CM

## 2018-10-12 DIAGNOSIS — I10 Essential (primary) hypertension: Secondary | ICD-10-CM

## 2018-10-14 ENCOUNTER — Encounter: Payer: Self-pay | Admitting: Obstetrics and Gynecology

## 2018-10-14 ENCOUNTER — Ambulatory Visit (INDEPENDENT_AMBULATORY_CARE_PROVIDER_SITE_OTHER): Payer: Medicare HMO | Admitting: Obstetrics and Gynecology

## 2018-10-14 ENCOUNTER — Other Ambulatory Visit: Payer: Self-pay

## 2018-10-14 VITALS — BP 143/78 | HR 49 | Ht 67.5 in | Wt 163.0 lb

## 2018-10-14 DIAGNOSIS — Z719 Counseling, unspecified: Secondary | ICD-10-CM

## 2018-10-14 NOTE — Progress Notes (Signed)
68 yo postmenopausal female presenting today for pap smear and information on which medical services/exams are needed yearly. Patient is without complaints. She denies any episodes of postmenopausal vaginal bleeding. She denies any urinary incontinence. Patient is without complaints and had a normal physical exam 09/2018  Blood pressure (!) 143/78, pulse (!) 49, height 5' 7.5" (1.715 m), weight 163 lb (73.9 kg).  GENERAL: Well-developed, well-nourished female in no acute distress.  NEURO: alert and oriented x 3  A/P 68 yo postmenopausal here for health education - Chart review demonstrates a normal pap smear since 2015. Informed patient that no further pap smears are needed given her age - Patient needs a yearly mammogram and should follow up given recent left breast biopsy - Informed patient that she needs an annual physical exam with pelvic exam - Patient advised to return to our office if she experiences postmenopausal vaginal bleeding - RTC prn

## 2018-10-14 NOTE — Progress Notes (Signed)
Pt is in the office for annual, last pap 03-11-17, last mammogram 09-07-18. Pt denies any abnormal symptoms. GAD 7= 0

## 2018-10-15 ENCOUNTER — Telehealth: Payer: Self-pay

## 2018-10-15 NOTE — Telephone Encounter (Signed)
Copied from [place] [id number]. Topic: General - Inquiry >> Oct 14, 2018  3:13 PM Lennox Solders wrote: reason for CRM: Pt is calling and would like a copy of her last blood work to be mail to home address

## 2018-10-18 DIAGNOSIS — Z1211 Encounter for screening for malignant neoplasm of colon: Secondary | ICD-10-CM | POA: Diagnosis not present

## 2018-10-18 DIAGNOSIS — Z1212 Encounter for screening for malignant neoplasm of rectum: Secondary | ICD-10-CM | POA: Diagnosis not present

## 2018-10-18 LAB — COLOGUARD

## 2018-10-19 ENCOUNTER — Other Ambulatory Visit: Payer: Self-pay

## 2018-10-19 DIAGNOSIS — E559 Vitamin D deficiency, unspecified: Secondary | ICD-10-CM

## 2018-10-19 DIAGNOSIS — I10 Essential (primary) hypertension: Secondary | ICD-10-CM

## 2018-10-19 MED ORDER — VITAMIN D (ERGOCALCIFEROL) 1.25 MG (50000 UNIT) PO CAPS: 50000.0000 [IU] | ORAL_CAPSULE | ORAL | 0 refills | Status: DC

## 2018-10-19 NOTE — Telephone Encounter (Signed)
Completed/mailed to pt/sent in Vit-Lydia Joyce/created future order/thx dmf

## 2018-10-19 NOTE — Progress Notes (Signed)
Labs mailed to pt/sent in Rx for Vit-Stasia Somero/created future order for Vit-Shiheem Corporan & CMP to be completed in 8 weeks/letter to pt explaining as well/thx dmf

## 2018-10-27 ENCOUNTER — Telehealth: Payer: Self-pay | Admitting: Family Medicine

## 2018-10-27 NOTE — Telephone Encounter (Signed)
Copied from [place] [phone]. Topic: General - Other >> Oct 21, 2018 11:11 AM Rayann Heman wrote: Reason for CRM: pt called and stated that she would like a call back from the nurse regarding recent lab results. Please advise >> Oct 27, 2018 12:53 PM Scherrie Gerlach wrote: Pt would like a call back to explain the lab results , she doesn't know what they mean. >> Oct 21, 2018  2:11 PM Fareed Fung, Building surveyor, LPN wrote: Spoke with the pt, she request copy of lab result send to her home address.  Done.

## 2018-10-28 ENCOUNTER — Telehealth: Payer: Self-pay

## 2018-10-28 NOTE — Telephone Encounter (Signed)
I called and VM came on but I could not leave a message.

## 2018-10-28 NOTE — Telephone Encounter (Signed)
Lab results have been mailed to patient.

## 2018-10-28 NOTE — Telephone Encounter (Signed)
Copied from [place] [id number]. Topic: General - Other >> Oct 21, 2018 11:11 AM Rayann Heman wrote: Reason for CRM: pt called and stated that she would like a call back from the nurse regarding recent lab results. Please advise >> Oct 28, 2018  1:28 PM Scherrie Gerlach wrote: Pt called again and wants to know why she has not received a call back yet >> Oct 27, 2018 12:53 PM Scherrie Gerlach wrote: Pt would like a call back to explain the lab results , she doesn't know what they mean. >> Oct 21, 2018  2:11 PM Noitamyae, Building surveyor, LPN wrote: Spoke with the pt, she request copy of lab result send to her home address.  Done.

## 2018-11-02 NOTE — Telephone Encounter (Signed)
This has been completed/all labs were mailed to pt/thx dmf

## 2018-12-24 ENCOUNTER — Telehealth: Payer: Self-pay

## 2018-12-24 DIAGNOSIS — E039 Hypothyroidism, unspecified: Secondary | ICD-10-CM

## 2018-12-24 NOTE — Telephone Encounter (Signed)
Okay to change but please schedule follow up TSH and FT4 in 4-8 weeks.  Thank you

## 2018-12-24 NOTE — Telephone Encounter (Signed)
Pharmacy informed that Levothyroxin is  longer available. Pharmacy requesting change to Euthhyrox.  Ok to change?

## 2018-12-27 MED ORDER — LEVOTHYROXINE SODIUM 50 MCG PO TABS: 50.0000 ug | ORAL_TABLET | Freq: Every day | ORAL | 1 refills | Status: DC

## 2018-12-27 NOTE — Telephone Encounter (Signed)
Patient called to ask Dr Deborra Medina to please send the Rx for levothyroxine (SYNTHROID) 50 MCG tablet  To  [id number] [address], [place] Phone:  [phone]  Fax:  [phone]      They have the medication in stock, and she need it to be for the 90 day supply. Need Rx sent ASAP did not pick up new medication from [place] she does not want to change plus insurance will not cover the new medicine

## 2018-12-27 NOTE — Telephone Encounter (Signed)
eRx sent to pharmacy requested.

## 2018-12-27 NOTE — Addendum Note (Signed)
Addended by: Lucille Passy on: 12/27/2018 05:14 PM   Modules accepted: Orders

## 2018-12-27 NOTE — Telephone Encounter (Signed)
.  bt

## 2018-12-29 NOTE — Telephone Encounter (Signed)
Per patient, her insurance will not cover 90 day supply. She is asking for the 90 day to be recalled and update it as 30 day. After January 16th, she will be able to have 90 day supply.

## 2018-12-31 MED ORDER — LEVOTHYROXINE SODIUM 50 MCG PO TABS: 50.0000 ug | ORAL_TABLET | Freq: Every day | ORAL | 2 refills | Status: DC

## 2018-12-31 NOTE — Telephone Encounter (Signed)
Resent as 30d/thx dmf

## 2018-12-31 NOTE — Addendum Note (Signed)
Addended by: Marrion Coy on: 12/31/2018 04:44 PM   Modules accepted: Orders

## 2019-01-11 NOTE — Telephone Encounter (Signed)
Pt called in to make provider aware that she did receive Rx., thank you . Pt says that she would also like to inquire about getting the covid antibody test if available at her apt?    Please assist

## 2019-01-24 ENCOUNTER — Other Ambulatory Visit: Payer: Self-pay

## 2019-01-24 ENCOUNTER — Telehealth: Payer: Self-pay | Admitting: Family Medicine

## 2019-01-24 DIAGNOSIS — Z20822 Contact with and (suspected) exposure to covid-19: Secondary | ICD-10-CM

## 2019-01-24 NOTE — Progress Notes (Signed)
Aniceto Boss 3 hours ago (11:28 AM)  AD   I called pt to prescreen for her lab tomorrow and she said she had talked to someone about also getting the covid antibody test tomorrow, Im not sure if it has been ordered because I don't know what it is officially called. Can you please check on this     Lucille Passy, MD 3 hours ago (11:41 AM)     Faythe Ghee to order as requested.

## 2019-01-24 NOTE — Telephone Encounter (Signed)
Okay to order as requested. 

## 2019-01-24 NOTE — Telephone Encounter (Signed)
I called pt to prescreen for her lab tomorrow and she said she had talked to someone about also getting the covid antibody test tomorrow, Im not sure if it has been ordered because I don't know what it is officially called. Can you please check on this

## 2019-01-24 NOTE — Telephone Encounter (Signed)
Order in, per Dr. Deborra Medina.

## 2019-01-25 ENCOUNTER — Other Ambulatory Visit (INDEPENDENT_AMBULATORY_CARE_PROVIDER_SITE_OTHER): Payer: Medicare HMO

## 2019-01-25 DIAGNOSIS — E559 Vitamin D deficiency, unspecified: Secondary | ICD-10-CM | POA: Diagnosis not present

## 2019-01-25 DIAGNOSIS — Z20822 Contact with and (suspected) exposure to covid-19: Secondary | ICD-10-CM | POA: Diagnosis not present

## 2019-01-25 DIAGNOSIS — I10 Essential (primary) hypertension: Secondary | ICD-10-CM | POA: Diagnosis not present

## 2019-01-25 DIAGNOSIS — E039 Hypothyroidism, unspecified: Secondary | ICD-10-CM

## 2019-01-25 LAB — COMPREHENSIVE METABOLIC PANEL
ALT: 19 U/L (ref 0–35)
AST: 22 U/L (ref 0–37)
Albumin: 4.1 g/dL (ref 3.5–5.2)
Alkaline Phosphatase: 79 U/L (ref 39–117)
BUN: 17 mg/dL (ref 6–23)
CO2: 32 mEq/L (ref 19–32)
Calcium: 9.5 mg/dL (ref 8.4–10.5)
Chloride: 98 mEq/L (ref 96–112)
Creatinine, Ser: 1.15 mg/dL (ref 0.40–1.20)
GFR: 57.06 mL/min — ABNORMAL LOW (ref >60.00)
Glucose, Bld: 93 mg/dL (ref 70–99)
Potassium: 3.4 mEq/L — ABNORMAL LOW (ref 3.5–5.1)
Sodium: 137 mEq/L (ref 135–145)
Total Bilirubin: 0.5 mg/dL (ref 0.2–1.2)
Total Protein: 6.8 g/dL (ref 6.0–8.3)

## 2019-01-25 LAB — T4, FREE: Free T4: 1.06 ng/dL (ref 0.60–1.60)

## 2019-01-25 LAB — VITAMIN D 25 HYDROXY (VIT D DEFICIENCY, FRACTURES): VITD: 40.92 ng/mL (ref 30.00–100.00)

## 2019-01-25 LAB — TSH: TSH: 1.66 u[IU]/mL (ref 0.35–4.50)

## 2019-01-25 NOTE — Addendum Note (Signed)
Addended by: Marrion Coy on: 01/25/2019 09:56 AM   Modules accepted: Orders

## 2019-01-25 NOTE — Addendum Note (Signed)
Addended by: Lynnea Ferrier on: 01/25/2019 10:17 AM   Modules accepted: Orders

## 2019-01-26 LAB — SARS COV-2 SEROLOGY(COVID-19)AB(IGG,IGM),IMMUNOASSAY
SARS CoV-2 AB IgG: NEGATIVE
SARS CoV-2 IgM: NEGATIVE

## 2019-02-03 ENCOUNTER — Telehealth: Payer: Self-pay | Admitting: Family Medicine

## 2019-02-03 ENCOUNTER — Telehealth: Payer: Self-pay

## 2019-02-03 DIAGNOSIS — R002 Palpitations: Secondary | ICD-10-CM

## 2019-02-03 NOTE — Telephone Encounter (Signed)
Discussed labs with pt and mailed a copy/thx dmf

## 2019-02-03 NOTE — Telephone Encounter (Signed)
TA-Pt would like to ask 2 things:  1.) She would like to know what doctor that you had to look at her Mammogram result and if they are a family doctor and local?  2.) She says that her friend up and died/she is worried that she may have something wrong with her heart/she is requesting a referral to cardiology for a consult  She says that she loves you and she hopes that you and her will cross paths in the future/plz advise/thx dmf

## 2019-02-03 NOTE — Telephone Encounter (Signed)
Patient calling to get lab results. Please call patient at [phone].

## 2019-02-04 NOTE — Telephone Encounter (Signed)
Referral placed.

## 2019-02-04 NOTE — Telephone Encounter (Signed)
TA-She stated that she had a stress test years ago an wants another one/every now and then she feels a "little funny" but just wants a referral because she doesn't have one and wants to be checked over by a Card/plz advise/thx dmf

## 2019-02-04 NOTE — Addendum Note (Signed)
Addended by: Lucille Passy on: 02/04/2019 10:48 AM   Modules accepted: Orders

## 2019-02-04 NOTE — Telephone Encounter (Signed)
Is she asking about the radiologist?  I am not sure what she is asking. What are her symptoms?  I would be happy to refer her to cardiologist--what are symptoms.  Does she need to be seen in our office or UC first?

## 2019-02-07 ENCOUNTER — Ambulatory Visit: Payer: Medicare HMO | Admitting: Internal Medicine

## 2019-02-07 ENCOUNTER — Other Ambulatory Visit: Payer: Self-pay

## 2019-02-07 ENCOUNTER — Encounter: Payer: Self-pay | Admitting: Internal Medicine

## 2019-02-07 VITALS — BP 152/81 | HR 51 | Temp 97.1°F | Ht 68.0 in | Wt 163.0 lb

## 2019-02-07 DIAGNOSIS — R0789 Other chest pain: Secondary | ICD-10-CM | POA: Diagnosis not present

## 2019-02-07 DIAGNOSIS — I10 Essential (primary) hypertension: Secondary | ICD-10-CM

## 2019-02-07 DIAGNOSIS — E782 Mixed hyperlipidemia: Secondary | ICD-10-CM | POA: Diagnosis not present

## 2019-02-07 NOTE — Patient Instructions (Signed)
Medication Instructions:  No changes  *If you need a refill on your cardiac medications before your next appointment, please call your pharmacy*  Lab Work: not needed   Testing/Procedures:  will be schedule at  [place] has requested that you have an echocardiogram. Echocardiography is a painless test that uses sound waves to create images of your heart. It provides your doctor with information about the size and shape of your heart and how well your heart's chambers and valves are working. This procedure takes approximately one hour. There are no restrictions for this procedure.    Follow-Up: At [facility], you and your health needs are our priority.  As part of our continuing mission to provide you with exceptional heart care, we have created designated Provider Care Teams.  These Care Teams include your primary Cardiologist (physician) and Advanced Practice Providers (APPs -  Physician Assistants and Nurse Practitioners) who all work together to provide you with the care you need, when you need it.  Your next appointment:   2 week(s)  The format for your next appointment:   In Person  Provider:   Cherlynn Kaiser, MD

## 2019-02-07 NOTE — Progress Notes (Signed)
Cardiology Office Note:    Date:  02/07/2019   ID:  Lydia Joyce, DOB 03/12/1950, MRN [id number]  PCP:  Lucille Passy, MD  Cardiologist:  No primary care provider on file.  Electrophysiologist:  None   Referring MD: Lucille Passy, MD   Chief Complaint: Cardiovascular risk assessment  History of Present Illness:    Lydia Joyce is a 69 y.o. female with a hx of arthritis, hypertension, hyperlipidemia, hypothyroidism who presents today for cardiovascular risk assessment.  She has been dealing with a lot of stress in her life lately.  She has noticed a "funny feeling in [her] chest".  She denies significant chest pain, shortness of breath.  She exercises frequently, walks on a treadmill.  She is not limited in her ADLs or leisure activities.  She is concerned because she had a friend become quite ill quite rapidly, and she wants to make sure with her heart is healthy.  She is unsure how we would test for this.  We discussed indications for various cardiovascular screening.  We also discussed indications for stress testing.  We participated in shared decision making.  She has a history of hypertension and takes hydrochlorothiazide 25 mg daily.  She is also on aspirin 81 mg daily.  She takes an iron supplement.   Past Medical History:  Diagnosis Date  . Arthritis   . Chicken pox   . Hyperlipidemia, mixed   . Hypertension   . Hypothyroidism   . Persistent cough for 3 weeks or longer 02/24/2018  . Thyroid disease   . Vitamin D deficiency     Past Surgical History:  Procedure Laterality Date  . HERNIA REPAIR     umbilical    Current Medications: No outpatient medications have been marked as taking for the 02/07/19 encounter (Office Visit) with Elouise Munroe, MD.     Allergies:   Lisinopril   Social History   Socioeconomic History  . Marital status: Divorced    Spouse name: Not on file  . Number of children: Not on file  . Years of education: Not on file  . Highest  education level: Not on file  Occupational History  . Not on file  Tobacco Use  . Smoking status: Never Smoker  . Smokeless tobacco: Never Used  Substance and Sexual Activity  . Alcohol use: No  . Drug use: No  . Sexual activity: Not Currently    Partners: Male  Other Topics Concern  . Not on file  Social History Narrative   Exercise-- walk, treadmill 2x a week   Social Determinants of Health   Financial Resource Strain:   . Difficulty of Paying Living Expenses: Not on file  Food Insecurity:   . Worried About Charity fundraiser in the Last Year: Not on file  . Ran Out of Food in the Last Year: Not on file  Transportation Needs:   . Lack of Transportation (Medical): Not on file  . Lack of Transportation (Non-Medical): Not on file  Physical Activity:   . Days of Exercise per Week: Not on file  . Minutes of Exercise per Session: Not on file  Stress:   . Feeling of Stress : Not on file  Social Connections:   . Frequency of Communication with Friends and Family: Not on file  . Frequency of Social Gatherings with Friends and Family: Not on file  . Attends Religious Services: Not on file  . Active Member of Clubs or Organizations: Not  on file  . Attends Archivist Meetings: Not on file  . Marital Status: Not on file     Family History: The patient's family history includes Arthritis in her father and mother; Hypertension in her father; Stroke in her father and mother; Thyroid disease in her sister.  ROS:   Please see the history of present illness.    All other systems reviewed and are negative.  EKGs/Labs/Other Studies Reviewed:    The following studies were reviewed today:  EKG:  Sinus brady rate 51  Recent Labs: 10/04/2018: Hemoglobin 12.8; Platelets 243.0 01/25/2019: ALT 19; BUN 17; Creatinine, Ser 1.15; Potassium 3.4; Sodium 137; TSH 1.66  Recent Lipid Panel    Component Value Date/Time   CHOL 175 10/04/2018 1055   TRIG 82.0 10/04/2018 1055   HDL  44.70 10/04/2018 1055   CHOLHDL 4 10/04/2018 1055   VLDL 16.4 10/04/2018 1055   LDLCALC 114 (H) 10/04/2018 1055    Physical Exam:    VS:  BP (!) 152/81   Pulse (!) 51   Temp (!) 97.1 F (36.2 C)   Ht 5\' 8"  (1.727 m)   Wt 163 lb (73.9 kg)   SpO2 99%   BMI 24.78 kg/m     Wt Readings from Last 5 Encounters:  02/07/19 163 lb (73.9 kg)  10/14/18 163 lb (73.9 kg)  10/04/18 163 lb 3.2 oz (74 kg)  02/24/18 157 lb 6.4 oz (71.4 kg)  03/11/17 165 lb 6.4 oz (75 kg)     Constitutional: No acute distress Eyes: sclera non-icteric, normal conjunctiva and lids ENMT: normal dentition, moist mucous membranes Cardiovascular: regular rhythm, normal rate, no murmurs. S1 and S2 normal. Radial pulses normal bilaterally. No jugular venous distention.  Respiratory: clear to auscultation bilaterally GI : normal bowel sounds, soft and nontender. No distention.   MSK: extremities warm, well perfused. No edema.  NEURO: grossly nonfocal exam, moves all extremities. PSYCH: alert and oriented x 3, normal mood and affect.   ASSESSMENT:    1. Essential hypertension   2. Hyperlipidemia, mixed   3. Atypical chest pain    PLAN:    Hypertension-she feels that her blood pressure is elevated because of medical contact.  She does take hydrochlorothiazide 25 mg daily, we should continue this.  She may need an additional agent such as amlodipine.  We will follow-up the results of the echocardiogram and tailor therapy.  We will obtain an echocardiogram to assess structure and function in the setting of hypertension and atypical chest pain.  Atypical chest pain-we will obtain an echocardiogram to assess structure and function.  Her chest pain may be mostly related to stress, however we have discussed the very presentation of ischemia in women.  We have a low threshold to perform stress testing if her symptoms continue or worsen.  She does take a baby aspirin daily.  Hyperlipidemia-LDL is not optimized at this  time.  Not currently on lipid-lowering therapy.  We will discuss this for best cardiovascular risk reduction.  Total time of encounter: 45 minutes total time of encounter, including 25 minutes spent in face-to-face patient care. This time includes coordination of care and counseling regarding above mentioned issues. Remainder of non-face-to-face time involved reviewing chart documents/testing relevant to the patient encounter and documentation in the medical record.  Cherlynn Kaiser, MD [place]  [facility]   Medication Adjustments/Labs and Tests Ordered: Current medicines are reviewed at length with the patient today.  Concerns regarding medicines are outlined above.  Orders Placed This Encounter  Procedures  . EKG 12-Lead  . ECHOCARDIOGRAM COMPLETE   No orders of the defined types were placed in this encounter.   Patient Instructions  Medication Instructions:  No changes  *If you need a refill on your cardiac medications before your next appointment, please call your pharmacy*  Lab Work: not needed   Testing/Procedures:  will be schedule at  [facility] has requested that you have an echocardiogram. Echocardiography is a painless test that uses sound waves to create images of your heart. It provides your doctor with information about the size and shape of your heart and how well your heart's chambers and valves are working. This procedure takes approximately one hour. There are no restrictions for this procedure.    Follow-Up: At [facility], you and your health needs are our priority.  As part of our continuing mission to provide you with exceptional heart care, we have created designated Provider Care Teams.  These Care Teams include your primary Cardiologist (physician) and Advanced Practice Providers (APPs -  Physician Assistants and Nurse Practitioners) who all work together to provide you with the care you need, when you need  it.  Your next appointment:   2 week(s)  The format for your next appointment:   In Person  Provider:   Cherlynn Kaiser, MD

## 2019-02-08 ENCOUNTER — Encounter: Payer: Self-pay | Admitting: Internal Medicine

## 2019-02-21 ENCOUNTER — Other Ambulatory Visit: Payer: Self-pay

## 2019-02-21 ENCOUNTER — Ambulatory Visit (HOSPITAL_COMMUNITY): Payer: Medicare HMO | Attending: Cardiovascular Disease

## 2019-02-21 DIAGNOSIS — I10 Essential (primary) hypertension: Secondary | ICD-10-CM | POA: Diagnosis not present

## 2019-02-26 ENCOUNTER — Ambulatory Visit: Payer: Medicare HMO | Attending: Internal Medicine

## 2019-02-26 DIAGNOSIS — Z23 Encounter for immunization: Secondary | ICD-10-CM

## 2019-02-26 NOTE — Progress Notes (Signed)
   Covid-19 Vaccination Clinic  Name:  Lydia Joyce    MRN: [id number] DOB: 09/16/1950  02/26/2019  [name] was observed post Covid-19 immunization for 30 minutes based on pre-vaccination screening without incidence. She was provided with Vaccine Information Sheet and instruction to access the V-Safe system.   [name] was instructed to call 911 with any severe reactions post vaccine: Marland Kitchen Difficulty breathing  . Swelling of your face and throat  . A fast heartbeat  . A bad rash all over your body  . Dizziness and weakness    Immunizations Administered    Name Date Dose VIS Date Route   Pfizer COVID-19 Vaccine 02/26/2019  2:41 PM 0.3 mL 12/24/2018 Intramuscular   Manufacturer: [place]   Lot: [id number]   [place]: [id number]

## 2019-03-07 ENCOUNTER — Telehealth: Payer: Self-pay | Admitting: Internal Medicine

## 2019-03-07 ENCOUNTER — Ambulatory Visit: Payer: Medicare HMO | Admitting: Internal Medicine

## 2019-03-07 NOTE — Telephone Encounter (Signed)
Spoke to patient. She states she would like to follow up with primary  , for further instruction , since echo was normal   last blood pressure in the office was 152/81. RN  Offered an virtual appointment. Patient decline  Stating again she would like to  Follow up with primary . Patient thanked Rn and doctor for  the service she received. Patient states if she needs  Anymore service she would contact office

## 2019-03-07 NOTE — Telephone Encounter (Signed)
Patient has cancelled appointment scheduled for 03/10/19 due to being around people who have tested positive for COVID-19 on 03/04/19. Patient states she is inquiring about whether or not she needs to reschedule appointment for echocardiogram follow up if results were normal. Please advise.

## 2019-03-09 ENCOUNTER — Telehealth: Payer: Self-pay | Admitting: General Practice

## 2019-03-09 NOTE — Telephone Encounter (Signed)
Patient is calling requesting a TOC from Dr. Deborra Medina to Dr. Bryan Lemma. Pls advise. CB is [phone]

## 2019-03-10 ENCOUNTER — Ambulatory Visit: Payer: Medicare HMO | Admitting: Internal Medicine

## 2019-03-10 NOTE — Telephone Encounter (Signed)
Yes it is ok with me to schedule nurse visits (no more than once per week) for BP check

## 2019-03-10 NOTE — Telephone Encounter (Signed)
Ok.  Pt is scheduled for 3 Bp checks/nurse visits before her TOC visit w/Dr. C.

## 2019-03-10 NOTE — Telephone Encounter (Signed)
I spoke with pt and scheduled her a TOC visit.  Pt want to know if she could have a few nurse visits to check on her BP before her TOC visit.  Pt said that Dr. Deborra Medina informed her that this could be done to pt's request.  Is it ok to schedule pt for a few nurse visits before TOC ?  Please advise.

## 2019-03-15 ENCOUNTER — Ambulatory Visit: Payer: Medicare HMO

## 2019-03-22 ENCOUNTER — Ambulatory Visit: Payer: Medicare HMO

## 2019-03-22 ENCOUNTER — Ambulatory Visit: Payer: Medicare HMO | Attending: Internal Medicine

## 2019-03-22 DIAGNOSIS — Z23 Encounter for immunization: Secondary | ICD-10-CM | POA: Insufficient documentation

## 2019-03-22 NOTE — Progress Notes (Signed)
   Covid-19 Vaccination Clinic  Name:  Lydia Joyce    MRN: [id number] DOB: 1951-01-12  03/22/2019  [name] was observed post Covid-19 immunization for 15 minutes without incident. She was provided with Vaccine Information Sheet and instruction to access the V-Safe system.   [name] was instructed to call 911 with any severe reactions post vaccine: Marland Kitchen Difficulty breathing  . Swelling of face and throat  . A fast heartbeat  . A bad rash all over body  . Dizziness and weakness   Immunizations Administered    Name Date Dose VIS Date Route   Pfizer COVID-19 Vaccine 03/22/2019  2:28 PM 0.3 mL 12/24/2018 Intramuscular   Manufacturer: [place]   Lot: [id number]   [place]: [id number]

## 2019-03-29 ENCOUNTER — Ambulatory Visit: Payer: Medicare HMO

## 2019-03-31 ENCOUNTER — Telehealth: Payer: Self-pay | Admitting: General Practice

## 2019-03-31 NOTE — Telephone Encounter (Signed)
Patient is calling and requesting a call back from [place]. Pt didn't say what it was regarding to. CB is [phone]

## 2019-03-31 NOTE — Telephone Encounter (Signed)
Called and spoke with patient regarding upcoming appointment. Appointment has been scheduled for 04/08/19 at 9:30 AM with Dr. Bryan Lemma.

## 2019-04-07 ENCOUNTER — Other Ambulatory Visit: Payer: Self-pay

## 2019-04-08 ENCOUNTER — Encounter: Payer: Self-pay | Admitting: Family Medicine

## 2019-04-08 ENCOUNTER — Ambulatory Visit (INDEPENDENT_AMBULATORY_CARE_PROVIDER_SITE_OTHER): Payer: Medicare HMO | Admitting: Family Medicine

## 2019-04-08 VITALS — BP 130/82 | HR 58 | Temp 97.8°F | Ht 68.0 in | Wt 165.4 lb

## 2019-04-08 DIAGNOSIS — M25511 Pain in right shoulder: Secondary | ICD-10-CM | POA: Diagnosis not present

## 2019-04-08 MED ORDER — DICLOFENAC SODIUM 1 % EX GEL: 4.0000 g | Freq: Four times a day (QID) | CUTANEOUS | 0 refills | Status: DC

## 2019-04-08 NOTE — Progress Notes (Signed)
Lydia Joyce is a 69 y.o. female  Chief Complaint  Patient presents with  . Establish Care    Pt c/o rt shoulder pain, x 10months.  Pt would like an x-ray of shoulder and also wants to discuss Bp medication.    HPI: Lydia Joyce is a 70 y.o. female seen today for TOC appt, previous PCP was Dr. Deborra Medina. She is originally from [place], came here for school 43 years ago.  Pt has a PMHx significant for HTN, hyperlipidemia, hypothyroidism, arthritis.  She complains of Rt shoulder pain x 2 mo. No specific injury or trauma.  She has not taken any tylenol or ibuprofen - doesn't like to take meds. She did apply an OTC pain patch which has been helpful. She did heating pad occasionally.  No weakness, no paresthesias.  She does not want to do an xray today but would like to have the option if it does not continue to improve.  She has a h/o HTN, controlled on current meds. She follows with cardio Dr. Margaretann Loveless.   OB-GYN - Dr. Lorriane Shire t  Past Medical History:  Diagnosis Date  . Arthritis   . Chicken pox   . Hyperlipidemia, mixed   . Hypertension   . Hypothyroidism   . Persistent cough for 3 weeks or longer 02/24/2018  . Thyroid disease   . Vitamin D deficiency     Past Surgical History:  Procedure Laterality Date  . HERNIA REPAIR     umbilical    Social History   Socioeconomic History  . Marital status: Divorced    Spouse name: Not on file  . Number of children: Not on file  . Years of education: Not on file  . Highest education level: Not on file  Occupational History  . Not on file  Tobacco Use  . Smoking status: Never Smoker  . Smokeless tobacco: Never Used  Substance and Sexual Activity  . Alcohol use: No  . Drug use: No  . Sexual activity: Not Currently    Partners: Male  Other Topics Concern  . Not on file  Social History Narrative   Exercise-- walk, treadmill 2x a week   Social Determinants of Health   Financial Resource Strain:   . Difficulty of Paying  Living Expenses:   Food Insecurity:   . Worried About Charity fundraiser in the Last Year:   . Arboriculturist in the Last Year:   Transportation Needs:   . Film/video editor (Medical):   Marland Kitchen Lack of Transportation (Non-Medical):   Physical Activity:   . Days of Exercise per Week:   . Minutes of Exercise per Session:   Stress:   . Feeling of Stress :   Social Connections:   . Frequency of Communication with Friends and Family:   . Frequency of Social Gatherings with Friends and Family:   . Attends Religious Services:   . Active Member of Clubs or Organizations:   . Attends Archivist Meetings:   Marland Kitchen Marital Status:   Intimate Partner Violence:   . Fear of Current or Ex-Partner:   . Emotionally Abused:   Marland Kitchen Physically Abused:   . Sexually Abused:     Family History  Problem Relation Age of Onset  . Arthritis Mother   . Stroke Mother   . Arthritis Father   . Stroke Father   . Hypertension Father   . Thyroid disease Sister      Immunization History  Administered Date(s) Administered  . Fluad Quad(high Dose 65+) 10/04/2018  . Influenza, High Dose Seasonal PF 11/26/2017  . Influenza, Quadrivalent, Recombinant, Inj, Pf 11/27/2016  . Influenza,inj,Quad PF,6+ Mos 11/22/2015  . Influenza-Unspecified 10/10/2013, 09/14/2014  . PFIZER SARS-COV-2 Vaccination 02/26/2019, 03/22/2019  . Td 02/08/2004  . Tdap 06/03/2010    Outpatient Encounter Medications as of 04/08/2019  Medication Sig Note  . aspirin EC 81 MG tablet Take 81 mg by mouth daily as needed.   . Biotin 10000 MCG TABS Take 1 tablet by mouth daily.   . Cholecalciferol (VITAMIN D3) 5000 UNITS CAPS Take by mouth.   . Ferrous Sulfate (IRON) 325 (65 FE) MG TABS Take 1 tablet by mouth daily as needed.    . hydrochlorothiazide (HYDRODIURIL) 25 MG tablet Take 1 tablet by mouth once daily   . levothyroxine (SYNTHROID) 50 MCG tablet Take 1 tablet (50 mcg total) by mouth daily.   . Misc Natural Products (GLUCOSAMINE  CHOND COMPLEX/MSM) TABS Take 1 tablet by mouth daily. 06/26/2014: Pt. reports taking as needed  . Multiple Vitamin (MULTIVITAMIN) tablet Take 1 tablet by mouth daily.   . Vitamin D, Ergocalciferol, (DRISDOL) 1.25 MG (50000 UT) CAPS capsule Take 1 capsule (50,000 Units total) by mouth every 7 (seven) days.   . Zinc Acetate, Oral, (ZINC ACETATE PO) Take 5 mg by mouth daily.   . Ascorbic Acid (VITAMIN C) 1000 MG tablet Take 1,000 mg by mouth daily.   . diclofenac Sodium (VOLTAREN) 1 % GEL Apply 4 g topically 4 (four) times daily.    No facility-administered encounter medications on file as of 04/08/2019.     ROS: Pertinent positives and negatives noted in HPI. Remainder of ROS non-contributory    Allergies  Allergen Reactions  . Lisinopril Swelling    Lip swelling    BP 130/82 (BP Location: Left Arm, Patient Position: Sitting, Cuff Size: Normal)   Pulse (!) 58   Temp 97.8 F (36.6 C) (Temporal)   Ht 5\' 8"  (1.727 m)   Wt 165 lb 6.4 oz (75 kg)   SpO2 99%   BMI 25.15 kg/m   Physical Exam  Constitutional: She is oriented to person, place, and time. She appears well-developed and well-nourished. No distress.  Musculoskeletal:        General: Normal range of motion.     Right shoulder: Tenderness present. No swelling, effusion, bony tenderness or crepitus. Normal range of motion. Normal strength.  Neurological: She is alert and oriented to person, place, and time.  Psychiatric: She has a normal mood and affect. Her behavior is normal.     A/P:  1. Right shoulder pain, unspecified chronicity - x 2 mo - pt is hesitant about oral med even tylenol and ibuprofen Rx:  - diclofenac Sodium (VOLTAREN) 1 % GEL; Apply 4 g topically 4 (four) times daily.  Dispense: 100 g; Refill: 0 - cont with heat, daily exercises - f/u in 2-3 wks if no/minimal improvement  Discussed plan and reviewed medications with patient, including risks, benefits, and potential side effects. Pt expressed understand.  All questions answered.   This visit occurred during the SARS-CoV-2 public health emergency.  Safety protocols were in place, including screening questions prior to the visit, additional usage of staff PPE, and extensive cleaning of exam room while observing appropriate contact time as indicated for disinfecting solutions.

## 2019-04-14 DIAGNOSIS — R69 Illness, unspecified: Secondary | ICD-10-CM | POA: Diagnosis not present

## 2019-04-19 ENCOUNTER — Ambulatory Visit: Payer: Self-pay | Admitting: General Surgery

## 2019-04-19 DIAGNOSIS — D242 Benign neoplasm of left breast: Secondary | ICD-10-CM | POA: Diagnosis not present

## 2019-04-27 ENCOUNTER — Other Ambulatory Visit: Payer: Self-pay

## 2019-04-27 ENCOUNTER — Telehealth: Payer: Self-pay | Admitting: Family Medicine

## 2019-04-27 DIAGNOSIS — I10 Essential (primary) hypertension: Secondary | ICD-10-CM

## 2019-04-27 MED ORDER — HYDROCHLOROTHIAZIDE 25 MG PO TABS: 25.0000 mg | ORAL_TABLET | Freq: Every day | ORAL | 1 refills | Status: DC

## 2019-04-27 NOTE — Telephone Encounter (Signed)
Patient is calling and requesting a refill for hydrochlorothiazide sent to [facility] on [address]. CB is [phone]

## 2019-04-27 NOTE — Telephone Encounter (Signed)
Refill sent in

## 2019-04-29 ENCOUNTER — Other Ambulatory Visit: Payer: Self-pay

## 2019-04-29 DIAGNOSIS — E039 Hypothyroidism, unspecified: Secondary | ICD-10-CM

## 2019-04-29 MED ORDER — LEVOTHYROXINE SODIUM 50 MCG PO TABS: 50.0000 ug | ORAL_TABLET | Freq: Every day | ORAL | 2 refills | Status: DC

## 2019-05-24 ENCOUNTER — Other Ambulatory Visit: Payer: Self-pay

## 2019-05-24 ENCOUNTER — Encounter (HOSPITAL_BASED_OUTPATIENT_CLINIC_OR_DEPARTMENT_OTHER): Payer: Self-pay | Admitting: General Surgery

## 2019-05-25 ENCOUNTER — Encounter (HOSPITAL_BASED_OUTPATIENT_CLINIC_OR_DEPARTMENT_OTHER)
Admission: RE | Admit: 2019-05-25 | Discharge: 2019-05-25 | Disposition: A | Discharge status: Home / Self Care | Payer: Medicare HMO | Source: Ambulatory Visit | Attending: General Surgery | Admitting: General Surgery

## 2019-05-25 DIAGNOSIS — Z01812 Encounter for preprocedural laboratory examination: Secondary | ICD-10-CM | POA: Diagnosis not present

## 2019-05-25 LAB — BASIC METABOLIC PANEL
Anion gap: 9 (ref 5–15)
BUN: 17 mg/dL (ref 8–23)
CO2: 29 mmol/L (ref 22–32)
Calcium: 9.5 mg/dL (ref 8.9–10.3)
Chloride: 100 mmol/L (ref 98–111)
Creatinine, Ser: 1.18 mg/dL — ABNORMAL HIGH (ref 0.44–1.00)
GFR calc Af Amer: 56 mL/min — ABNORMAL LOW (ref >60)
GFR calc non Af Amer: 48 mL/min — ABNORMAL LOW (ref >60)
Glucose, Bld: 98 mg/dL (ref 70–99)
Potassium: 3.8 mmol/L (ref 3.5–5.1)
Sodium: 138 mmol/L (ref 135–145)

## 2019-05-25 NOTE — Progress Notes (Signed)

## 2019-05-28 ENCOUNTER — Other Ambulatory Visit (HOSPITAL_COMMUNITY)
Admission: RE | Admit: 2019-05-28 | Discharge: 2019-05-28 | Disposition: A | Discharge status: Home / Self Care | Payer: Medicare HMO | Source: Ambulatory Visit | Attending: General Surgery | Admitting: General Surgery

## 2019-05-28 DIAGNOSIS — Z01812 Encounter for preprocedural laboratory examination: Secondary | ICD-10-CM | POA: Insufficient documentation

## 2019-05-28 DIAGNOSIS — Z20822 Contact with and (suspected) exposure to covid-19: Secondary | ICD-10-CM | POA: Insufficient documentation

## 2019-05-28 LAB — SARS CORONAVIRUS 2 (TAT 6-24 HRS): SARS Coronavirus 2: NEGATIVE

## 2019-05-31 DIAGNOSIS — D242 Benign neoplasm of left breast: Secondary | ICD-10-CM | POA: Diagnosis not present

## 2019-06-01 ENCOUNTER — Ambulatory Visit (HOSPITAL_BASED_OUTPATIENT_CLINIC_OR_DEPARTMENT_OTHER): Payer: Medicare HMO | Admitting: Certified Registered"

## 2019-06-01 ENCOUNTER — Other Ambulatory Visit: Payer: Self-pay

## 2019-06-01 ENCOUNTER — Ambulatory Visit (HOSPITAL_BASED_OUTPATIENT_CLINIC_OR_DEPARTMENT_OTHER)
Admission: RE | Admit: 2019-06-01 | Discharge: 2019-06-01 | Disposition: A | Discharge status: Home / Self Care | Payer: Medicare HMO | Attending: General Surgery | Admitting: General Surgery

## 2019-06-01 ENCOUNTER — Encounter (HOSPITAL_BASED_OUTPATIENT_CLINIC_OR_DEPARTMENT_OTHER)
Admission: RE | Disposition: A | Discharge status: Home / Self Care | Payer: Self-pay | Source: Home / Self Care | Attending: General Surgery

## 2019-06-01 ENCOUNTER — Encounter (HOSPITAL_BASED_OUTPATIENT_CLINIC_OR_DEPARTMENT_OTHER): Payer: Self-pay | Admitting: General Surgery

## 2019-06-01 DIAGNOSIS — Z79899 Other long term (current) drug therapy: Secondary | ICD-10-CM | POA: Diagnosis not present

## 2019-06-01 DIAGNOSIS — Z888 Allergy status to other drugs, medicaments and biological substances status: Secondary | ICD-10-CM | POA: Insufficient documentation

## 2019-06-01 DIAGNOSIS — Z79890 Hormone replacement therapy (postmenopausal): Secondary | ICD-10-CM | POA: Insufficient documentation

## 2019-06-01 DIAGNOSIS — I10 Essential (primary) hypertension: Secondary | ICD-10-CM | POA: Diagnosis not present

## 2019-06-01 DIAGNOSIS — M1990 Unspecified osteoarthritis, unspecified site: Secondary | ICD-10-CM | POA: Insufficient documentation

## 2019-06-01 DIAGNOSIS — D242 Benign neoplasm of left breast: Secondary | ICD-10-CM | POA: Insufficient documentation

## 2019-06-01 DIAGNOSIS — E559 Vitamin D deficiency, unspecified: Secondary | ICD-10-CM | POA: Diagnosis not present

## 2019-06-01 DIAGNOSIS — N6092 Unspecified benign mammary dysplasia of left breast: Secondary | ICD-10-CM | POA: Diagnosis not present

## 2019-06-01 DIAGNOSIS — E039 Hypothyroidism, unspecified: Secondary | ICD-10-CM | POA: Diagnosis not present

## 2019-06-01 DIAGNOSIS — Z7982 Long term (current) use of aspirin: Secondary | ICD-10-CM | POA: Diagnosis not present

## 2019-06-01 HISTORY — PX: BREAST LUMPECTOMY WITH RADIOACTIVE SEED LOCALIZATION: SHX6424

## 2019-06-01 SURGERY — BREAST LUMPECTOMY WITH RADIOACTIVE SEED LOCALIZATION
Anesthesia: General | Site: Breast | Laterality: Left

## 2019-06-01 MED ORDER — FENTANYL CITRATE (PF) 100 MCG/2ML IJ SOLN
INTRAMUSCULAR | Status: DC | PRN
  Administered 2019-06-01 (×2): 50 ug via INTRAVENOUS

## 2019-06-01 MED ORDER — PHENYLEPHRINE 40 MCG/ML (10ML) SYRINGE FOR IV PUSH (FOR BLOOD PRESSURE SUPPORT)
PREFILLED_SYRINGE | INTRAVENOUS | Status: AC
  Filled 2019-06-01: qty 20

## 2019-06-01 MED ORDER — EPHEDRINE 5 MG/ML INJ
INTRAVENOUS | Status: AC
  Filled 2019-06-01: qty 20

## 2019-06-01 MED ORDER — CELECOXIB 200 MG PO CAPS
ORAL_CAPSULE | ORAL | Status: AC
  Filled 2019-06-01: qty 1

## 2019-06-01 MED ORDER — ACETAMINOPHEN 500 MG PO TABS
ORAL_TABLET | ORAL | Status: AC
  Filled 2019-06-01: qty 2

## 2019-06-01 MED ORDER — GABAPENTIN 300 MG PO CAPS
300.0000 mg | ORAL_CAPSULE | ORAL | Status: AC
  Administered 2019-06-01: 300 mg via ORAL

## 2019-06-01 MED ORDER — LIDOCAINE HCL (CARDIAC) PF 100 MG/5ML IV SOSY
PREFILLED_SYRINGE | INTRAVENOUS | Status: DC | PRN
  Administered 2019-06-01: 60 mg via INTRAVENOUS

## 2019-06-01 MED ORDER — LACTATED RINGERS IV SOLN: INTRAVENOUS | Status: DC

## 2019-06-01 MED ORDER — DEXAMETHASONE SODIUM PHOSPHATE 4 MG/ML IJ SOLN
INTRAMUSCULAR | Status: DC | PRN
  Administered 2019-06-01: 4 mg via INTRAVENOUS

## 2019-06-01 MED ORDER — MIDAZOLAM HCL 2 MG/2ML IJ SOLN: 1.0000 mg | INTRAMUSCULAR | Status: DC | PRN

## 2019-06-01 MED ORDER — CELECOXIB 200 MG PO CAPS
200.0000 mg | ORAL_CAPSULE | ORAL | Status: AC
  Administered 2019-06-01: 200 mg via ORAL

## 2019-06-01 MED ORDER — PROPOFOL 10 MG/ML IV BOLUS
INTRAVENOUS | Status: DC | PRN
  Administered 2019-06-01: 150 mg via INTRAVENOUS

## 2019-06-01 MED ORDER — GLYCOPYRROLATE 0.2 MG/ML IJ SOLN
INTRAMUSCULAR | Status: DC | PRN
  Administered 2019-06-01: .1 mg via INTRAVENOUS

## 2019-06-01 MED ORDER — CEFAZOLIN SODIUM-DEXTROSE 2-4 GM/100ML-% IV SOLN
INTRAVENOUS | Status: AC
  Filled 2019-06-01: qty 100

## 2019-06-01 MED ORDER — GABAPENTIN 300 MG PO CAPS
ORAL_CAPSULE | ORAL | Status: AC
  Filled 2019-06-01: qty 1

## 2019-06-01 MED ORDER — FENTANYL CITRATE (PF) 100 MCG/2ML IJ SOLN: 50.0000 ug | INTRAMUSCULAR | Status: DC | PRN

## 2019-06-01 MED ORDER — ACETAMINOPHEN 500 MG PO TABS
1000.0000 mg | ORAL_TABLET | ORAL | Status: AC
  Administered 2019-06-01: 1000 mg via ORAL

## 2019-06-01 MED ORDER — LIDOCAINE 2% (20 MG/ML) 5 ML SYRINGE
INTRAMUSCULAR | Status: AC
  Filled 2019-06-01: qty 30

## 2019-06-01 MED ORDER — ONDANSETRON HCL 4 MG/2ML IJ SOLN
INTRAMUSCULAR | Status: DC | PRN
  Administered 2019-06-01: 4 mg via INTRAVENOUS

## 2019-06-01 MED ORDER — HYDROCODONE-ACETAMINOPHEN 5-325 MG PO TABS: 1.0000 | ORAL_TABLET | Freq: Four times a day (QID) | ORAL | 0 refills | Status: DC | PRN

## 2019-06-01 MED ORDER — FENTANYL CITRATE (PF) 100 MCG/2ML IJ SOLN: 25.0000 ug | INTRAMUSCULAR | Status: DC | PRN

## 2019-06-01 MED ORDER — CHLORHEXIDINE GLUCONATE CLOTH 2 % EX PADS: 6.0000 | MEDICATED_PAD | Freq: Once | CUTANEOUS | Status: DC

## 2019-06-01 MED ORDER — ONDANSETRON HCL 4 MG/2ML IJ SOLN
INTRAMUSCULAR | Status: AC
  Filled 2019-06-01: qty 20

## 2019-06-01 MED ORDER — DEXAMETHASONE SODIUM PHOSPHATE 10 MG/ML IJ SOLN
INTRAMUSCULAR | Status: AC
  Filled 2019-06-01: qty 3

## 2019-06-01 MED ORDER — GLYCOPYRROLATE PF 0.2 MG/ML IJ SOSY
PREFILLED_SYRINGE | INTRAMUSCULAR | Status: AC
  Filled 2019-06-01: qty 2

## 2019-06-01 MED ORDER — FENTANYL CITRATE (PF) 100 MCG/2ML IJ SOLN
INTRAMUSCULAR | Status: AC
  Filled 2019-06-01: qty 2

## 2019-06-01 MED ORDER — EPHEDRINE SULFATE 50 MG/ML IJ SOLN
INTRAMUSCULAR | Status: DC | PRN
  Administered 2019-06-01: 10 mg via INTRAVENOUS

## 2019-06-01 MED ORDER — CEFAZOLIN SODIUM-DEXTROSE 2-4 GM/100ML-% IV SOLN
2.0000 g | INTRAVENOUS | Status: AC
  Administered 2019-06-01: 2 g via INTRAVENOUS

## 2019-06-01 MED ORDER — BUPIVACAINE HCL (PF) 0.25 % IJ SOLN
INTRAMUSCULAR | Status: DC | PRN
  Administered 2019-06-01: 20 mL

## 2019-06-01 SURGICAL SUPPLY — 45 items
APPLIER CLIP 9.375 MED OPEN (MISCELLANEOUS)
BLADE SURG 15 STRL LF DISP TIS (BLADE) ×1 IMPLANT
BLADE SURG 15 STRL SS (BLADE) ×2
CANISTER SUC SOCK COL 7IN (MISCELLANEOUS) ×1 IMPLANT
CANISTER SUCT 1200ML W/VALVE (MISCELLANEOUS) ×1 IMPLANT
CHLORAPREP W/TINT 26 (MISCELLANEOUS) ×2 IMPLANT
CLIP APPLIE 9.375 MED OPEN (MISCELLANEOUS) IMPLANT
COVER BACK TABLE 60X90IN (DRAPES) ×2 IMPLANT
COVER MAYO STAND STRL (DRAPES) ×2 IMPLANT
COVER PROBE W GEL 5X96 (DRAPES) ×2 IMPLANT
COVER WAND RF STERILE (DRAPES) IMPLANT
DECANTER SPIKE VIAL GLASS SM (MISCELLANEOUS) IMPLANT
DERMABOND ADVANCED (GAUZE/BANDAGES/DRESSINGS) ×1
DERMABOND ADVANCED .7 DNX12 (GAUZE/BANDAGES/DRESSINGS) ×1 IMPLANT
DRAPE LAPAROSCOPIC ABDOMINAL (DRAPES) ×2 IMPLANT
DRAPE UTILITY XL STRL (DRAPES) ×2 IMPLANT
ELECT COATED BLADE 2.86 ST (ELECTRODE) ×2 IMPLANT
ELECT REM PT RETURN 9FT ADLT (ELECTROSURGICAL) ×2
ELECTRODE REM PT RTRN 9FT ADLT (ELECTROSURGICAL) ×1 IMPLANT
GLOVE BIO SURGEON STRL SZ7.5 (GLOVE) ×4 IMPLANT
GLOVE BIOGEL PI IND STRL 6.5 (GLOVE) IMPLANT
GLOVE BIOGEL PI IND STRL 7.0 (GLOVE) IMPLANT
GLOVE BIOGEL PI INDICATOR 6.5 (GLOVE) ×1
GLOVE BIOGEL PI INDICATOR 7.0 (GLOVE) ×1
GLOVE ECLIPSE 6.5 STRL STRAW (GLOVE) ×1 IMPLANT
GOWN STRL REUS W/ TWL LRG LVL3 (GOWN DISPOSABLE) ×2 IMPLANT
GOWN STRL REUS W/TWL LRG LVL3 (GOWN DISPOSABLE) ×4
ILLUMINATOR WAVEGUIDE N/F (MISCELLANEOUS) IMPLANT
KIT MARKER MARGIN INK (KITS) ×2 IMPLANT
LIGHT WAVEGUIDE WIDE FLAT (MISCELLANEOUS) IMPLANT
NDL HYPO 25X1 1.5 SAFETY (NEEDLE) IMPLANT
NEEDLE HYPO 25X1 1.5 SAFETY (NEEDLE) ×2 IMPLANT
NS IRRIG 1000ML POUR BTL (IV SOLUTION) IMPLANT
PENCIL SMOKE EVACUATOR (MISCELLANEOUS) ×2 IMPLANT
SET BASIN DAY SURGERY F.S. (CUSTOM PROCEDURE TRAY) ×2 IMPLANT
SLEEVE SCD COMPRESS KNEE MED (MISCELLANEOUS) ×2 IMPLANT
SPONGE LAP 18X18 RF (DISPOSABLE) ×2 IMPLANT
SUT MON AB 4-0 PC3 18 (SUTURE) ×2 IMPLANT
SUT SILK 2 0 SH (SUTURE) IMPLANT
SUT VICRYL 3-0 CR8 SH (SUTURE) ×2 IMPLANT
SYR CONTROL 10ML LL (SYRINGE) ×1 IMPLANT
TOWEL GREEN STERILE FF (TOWEL DISPOSABLE) ×2 IMPLANT
TRAY FAXITRON CT DISP (TRAY / TRAY PROCEDURE) ×2 IMPLANT
TUBE CONNECTING 20X1/4 (TUBING) ×1 IMPLANT
YANKAUER SUCT BULB TIP NO VENT (SUCTIONS) IMPLANT

## 2019-06-01 NOTE — H&P (Signed)
Lydia Joyce  Location: [facility] Patient #: [id number] DOB: 12/14/1950 Unknown / Language: Cleophus Molt / Race: Black or African American Female   History of Present Illness  The patient is a 69 year old female who presents with a breast mass. We're asked to see the patient in consultation by Dr. Emmit Pomfret to evaluate her for an intraductal papilloma the left breast. The patient is a 69 year old black female who recently went for a routine screening mammogram. At that time she was found to have a 1.2 cm mass in the subareolar 9 o'clock position of the left breast suspicious. This was biopsied and came back as an intraductal papilloma. Because of its abnormal appearance and size the recommendation is to have this area removed. She denies any family history of breast cancer.   Past Surgical History  No pertinent past surgical history   Diagnostic Studies History  Mammogram  within last year Pap Smear  1-5 years ago  Allergies  Lisinopril *ANTIHYPERTENSIVES*  Swelling.  Medication History  Vitamin D (Ergocalciferol) (1.25 MG(50000 UT) Capsule, Oral) Active. Multivitamin (Oral) Active. Glucosamine Chondroitin (Oral) Active. Levothyroxine Sodium (50MCG Tablet, Oral) Active. hydroCHLOROthiazide (25MG  Tablet, Oral) Active. Iron (325 (65 Fe)MG Tablet, Oral) Active. Diclofenac Sodium (1% Gel, Transdermal) Active. Biotin (1000MCG Tablet, Oral) Active. Aspirin (81MG  Tablet, Oral) Active. Vitamin C (1000MG  Tablet, Oral) Active. Medications Reconciled  Social History  Alcohol use  Remotely quit alcohol use. No caffeine use  No drug use  Tobacco use  Never smoker.  Family History  Arthritis  Father, Mother. Hypertension  Father. Prostate Cancer  Father.  Pregnancy / Birth History  Age at menarche  64 years. Age of menopause  81-50 Gravida  2 Length (months) of breastfeeding  7-12 Maternal age  61-30 Para  2  Other Problems  High  blood pressure  Lump In Breast  Thyroid Disease  Umbilical Hernia Repair     Review of Systems  General Not Present- Appetite Loss, Chills, Fatigue, Fever, Night Sweats, Weight Gain and Weight Loss. Skin Present- Dryness. Not Present- Change in Wart/Mole, Hives, Jaundice, New Lesions, Non-Healing Wounds, Rash and Ulcer. HEENT Not Present- Earache, Hearing Loss, Hoarseness, Nose Bleed, Oral Ulcers, Ringing in the Ears, Seasonal Allergies, Sinus Pain, Sore Throat, Visual Disturbances, Wears glasses/contact lenses and Yellow Eyes. Respiratory Not Present- Bloody sputum, Chronic Cough, Difficulty Breathing, Snoring and Wheezing. Cardiovascular Not Present- Chest Pain, Difficulty Breathing Lying Down, Leg Cramps, Palpitations, Rapid Heart Rate, Shortness of Breath and Swelling of Extremities. Gastrointestinal Not Present- Abdominal Pain, Bloating, Bloody Stool, Change in Bowel Habits, Chronic diarrhea, Constipation, Difficulty Swallowing, Excessive gas, Gets full quickly at meals, Hemorrhoids, Indigestion, Nausea, Rectal Pain and Vomiting. Female Genitourinary Not Present- Frequency, Nocturia, Painful Urination, Pelvic Pain and Urgency. Musculoskeletal Not Present- Back Pain, Joint Pain, Joint Stiffness, Muscle Pain, Muscle Weakness and Swelling of Extremities. Neurological Not Present- Decreased Memory, Fainting, Headaches, Numbness, Seizures, Tingling, Tremor, Trouble walking and Weakness. Psychiatric Not Present- Anxiety, Bipolar, Change in Sleep Pattern, Depression, Fearful and Frequent crying. Endocrine Not Present- Cold Intolerance, Excessive Hunger, Hair Changes, Heat Intolerance, Hot flashes and New Diabetes. Hematology Not Present- Blood Thinners, Easy Bruising, Excessive bleeding, Gland problems, HIV and Persistent Infections.  Vitals  Weight: 166.13 lb Height: 67in Body Surface Area: 1.87 m Body Mass Index: 26.02 kg/m  Temp.: 60F  Pulse: 85 (Regular)  P.OX: 100% (Room  air) BP: 156/88(Sitting, Left Arm, Standard)       Physical Exam General Mental Status-Alert. General Appearance-Consistent with stated  age. Hydration-Well hydrated. Voice-Normal.  Head and Neck Head-normocephalic, atraumatic with no lesions or palpable masses. Trachea-midline. Thyroid Gland Characteristics - normal size and consistency.  Eye Eyeball - Bilateral-Extraocular movements intact. Sclera/Conjunctiva - Bilateral-No scleral icterus.  Chest and Lung Exam Chest and lung exam reveals -quiet, even and easy respiratory effort with no use of accessory muscles and on auscultation, normal breath sounds, no adventitious sounds and normal vocal resonance. Inspection Chest Wall - Normal. Back - normal.  Breast Note: There is no palpable mass in either breast. There is no palpable axillary, supraclavicular, or cervical lymphadenopathy.   Cardiovascular Cardiovascular examination reveals -normal heart sounds, regular rate and rhythm with no murmurs and normal pedal pulses bilaterally.  Abdomen Inspection Inspection of the abdomen reveals - No Hernias. Skin - Scar - no surgical scars. Palpation/Percussion Palpation and Percussion of the abdomen reveal - Soft, Non Tender, No Rebound tenderness, No Rigidity (guarding) and No hepatosplenomegaly. Auscultation Auscultation of the abdomen reveals - Bowel sounds normal.  Neurologic Neurologic evaluation reveals -alert and oriented x 3 with no impairment of recent or remote memory. Mental Status-Normal.  Musculoskeletal Normal Exam - Left-Upper Extremity Strength Normal and Lower Extremity Strength Normal. Normal Exam - Right-Upper Extremity Strength Normal and Lower Extremity Strength Normal.  Lymphatic Head & Neck  General Head & Neck Lymphatics: Bilateral - Description - Normal. Axillary  General Axillary Region: Bilateral - Description - Normal. Tenderness - Non Tender. Femoral &  Inguinal  Generalized Femoral & Inguinal Lymphatics: Bilateral - Description - Normal. Tenderness - Non Tender.    Assessment & Plan  INTRADUCTAL PAPILLOMA OF BREAST, LEFT (D24.2) Impression: The patient appears to have a 1.2 cm intraductal papilloma in the subareolar 9 o'clock position of the left breast. Because of its size and abnormal appearance of think it is very reasonable to remove this area. There is about a 5-10% chance of missing something more significant. I have discussed with her in detail the risks and benefits of the operation as well as some of the technical aspects including the use of a radioactive seed for localization and she understands and wishes to proceed. This patient encounter took 45 minutes today to perform the following: take history, perform exam, review outside records, interpret imaging, counsel the patient on their diagnosis and document encounter, findings & plan in the EHR

## 2019-06-01 NOTE — Transfer of Care (Signed)
Immediate Anesthesia Transfer of Care Note  Patient: Lydia Joyce  Procedure(s) Performed: LEFT BREAST LUMPECTOMY WITH RADIOACTIVE SEED LOCALIZATION (Left Breast)  Patient Location: PACU  Anesthesia Type:General  Level of Consciousness: drowsy and patient cooperative  Airway & Oxygen Therapy: Patient Spontanous Breathing and Patient connected to face mask oxygen  Post-op Assessment: Report given to RN and Post -op Vital signs reviewed and stable  Post vital signs: Reviewed and stable  Last Vitals:  Vitals Value Taken Time  BP    Temp    Pulse 57 06/01/19 1022  Resp 8 06/01/19 1022  SpO2 100 % 06/01/19 1022  Vitals shown include unvalidated device data.  Last Pain:  Vitals:   06/01/19 0902  TempSrc: Oral  PainSc: 0-No pain         Complications: No apparent anesthesia complications

## 2019-06-01 NOTE — Op Note (Signed)
06/01/2019  10:18 AM  PATIENT:  Lydia Joyce  69 y.o. female  PRE-OPERATIVE DIAGNOSIS:  LEFT BREAST PAPILLOMA  POST-OPERATIVE DIAGNOSIS:  LEFT BREAST PAPILLOMA  PROCEDURE:  Procedure(s): LEFT BREAST LUMPECTOMY WITH RADIOACTIVE SEED LOCALIZATION (Left)  SURGEON:  Surgeon(s) and Role:    Jovita Kussmaul, MD - Primary  PHYSICIAN ASSISTANT:   ASSISTANTS: none   ANESTHESIA:   local and general  EBL:  minimal   BLOOD ADMINISTERED:none  DRAINS: none   LOCAL MEDICATIONS USED:  MARCAINE     SPECIMEN:  Source of Specimen:  left breast tissue  DISPOSITION OF SPECIMEN:  PATHOLOGY  COUNTS:  YES  TOURNIQUET:  * No tourniquets in log *  DICTATION: .Dragon Dictation   After informed consent was obtained the patient was brought to the operating room and placed in the supine position on the operating table.  After adequate induction of general anesthesia the patient's left breast was prepped with ChloraPrep, allowed to dry, and draped in usual sterile manner.  An appropriate timeout was performed.  Previously an I-125 seed was placed in the central portion of the left breast to mark an area of intraductal papilloma.  The neoprobe was set to I-125 in the area of radioactivity was readily identified.  The area around this was infiltrated with quarter percent Marcaine.  A curvilinear incision was made along the edge of the upper areola of the left breast with a 15 blade knife.  The incision was carried through the skin and subcutaneous tissue sharply with the electrocautery.  The dissection was then carried towards the radioactive seed under the direction of the neoprobe.  Once I more closely approached the radioactive seed I then removed a circular portion of breast tissue sharply with the electrocautery around the radioactive seed while checking the area of radioactivity frequently.  Once the specimen was removed it was oriented with the appropriate paint colors.  A specimen radiograph was  obtained that showed the clip and seed to be within the specimen.  The specimen was then sent to pathology for further evaluation.  Hemostasis was achieved using the Bovie electrocautery.  The wound was irrigated with saline and infiltrated with more quarter percent Marcaine.  The deep layer of the wound was then closed with layers of interrupted 3-0 Vicryl stitches.  The skin was then closed with interrupted 4-0 Monocryl subcuticular stitches.  Dermabond dressings were applied.  The patient tolerated the procedure well.  At the end of the case all needle sponge and instrument counts were correct.  The patient was then awakened and taken to recovery in stable condition.  PLAN OF CARE: Discharge to home after PACU  PATIENT DISPOSITION:  PACU - hemodynamically stable.   Delay start of Pharmacological VTE agent (>24hrs) due to surgical blood loss or risk of bleeding: not applicable

## 2019-06-01 NOTE — Anesthesia Postprocedure Evaluation (Signed)
Anesthesia Post Note  Patient: Lydia Joyce  Procedure(s) Performed: LEFT BREAST LUMPECTOMY WITH RADIOACTIVE SEED LOCALIZATION (Left Breast)     Patient location during evaluation: PACU Anesthesia Type: General Level of consciousness: awake and alert Pain management: pain level controlled Vital Signs Assessment: post-procedure vital signs reviewed and stable Respiratory status: spontaneous breathing, nonlabored ventilation, respiratory function stable and patient connected to nasal cannula oxygen Cardiovascular status: blood pressure returned to baseline and stable Postop Assessment: no apparent nausea or vomiting Anesthetic complications: no    Last Vitals:  Vitals:   06/01/19 1115 06/01/19 1139  BP: 124/60 120/64  Pulse: (!) 55 (!) 56  Resp: 16 16  Temp:  36.6 C  SpO2: 100% 100%    Last Pain:  Vitals:   06/01/19 1139  TempSrc:   PainSc: 1                  Demetrick Eichenberger L Mylin Gignac

## 2019-06-01 NOTE — Anesthesia Procedure Notes (Signed)
Procedure Name: LMA Insertion Date/Time: 06/01/2019 9:54 AM Performed by: Signe Colt, CRNA Pre-anesthesia Checklist: Patient identified, Emergency Drugs available, Suction available and Patient being monitored Patient Re-evaluated:Patient Re-evaluated prior to induction Oxygen Delivery Method: Circle system utilized Preoxygenation: Pre-oxygenation with 100% oxygen Induction Type: IV induction Ventilation: Mask ventilation without difficulty LMA: LMA inserted LMA Size: 4.0 Number of attempts: 1 Airway Equipment and Method: Bite block Placement Confirmation: positive ETCO2 Tube secured with: Tape Dental Injury: Teeth and Oropharynx as per pre-operative assessment

## 2019-06-01 NOTE — Anesthesia Preprocedure Evaluation (Addendum)
Anesthesia Evaluation    Reviewed: Allergy & Precautions, Patient's Chart, lab work & pertinent test results  Airway Mallampati: III  TM Distance: >3 FB Neck ROM: Full    Dental no notable dental hx. (+) Teeth Intact, Dental Advisory Given   Pulmonary neg pulmonary ROS,    Pulmonary exam normal breath sounds clear to auscultation       Cardiovascular hypertension, Pt. on medications Normal cardiovascular exam Rhythm:Regular Rate:Normal  TTE 2021 Normal EF, valves ok   Neuro/Psych negative neurological ROS  negative psych ROS   GI/Hepatic negative GI ROS, Neg liver ROS,   Endo/Other  Hypothyroidism   Renal/GU negative Renal ROS  negative genitourinary   Musculoskeletal  (+) Arthritis ,   Abdominal   Peds  Hematology negative hematology ROS (+)   Anesthesia Other Findings   Reproductive/Obstetrics                            Anesthesia Physical Anesthesia Plan  ASA: II  Anesthesia Plan: General   Post-op Pain Management:    Induction: Intravenous  PONV Risk Score and Plan: 3 and Ondansetron, Dexamethasone and Midazolam  Airway Management Planned: LMA  Additional Equipment:   Intra-op Plan:   Post-operative Plan: Extubation in OR  Informed Consent: I have reviewed the patients History and Physical, chart, labs and discussed the procedure including the risks, benefits and alternatives for the proposed anesthesia with the patient or authorized representative who has indicated his/her understanding and acceptance.     Dental advisory given  Plan Discussed with: CRNA  Anesthesia Plan Comments:         Anesthesia Quick Evaluation

## 2019-06-01 NOTE — Discharge Instructions (Signed)
°  Post Anesthesia Home Care Instructions  Activity: Get plenty of rest for the remainder of the day. A responsible individual must stay with you for 24 hours following the procedure.  For the next 24 hours, DO NOT: -Drive a car -Paediatric nurse -Drink alcoholic beverages -Take any medication unless instructed by your physician -Make any legal decisions or sign important papers.  Meals: Start with liquid foods such as gelatin or soup. Progress to regular foods as tolerated. Avoid greasy, spicy, heavy foods. If nausea and/or vomiting occur, drink only clear liquids until the nausea and/or vomiting subsides. Call your physician if vomiting continues.  Special Instructions/Symptoms: Your throat may feel dry or sore from the anesthesia or the breathing tube placed in your throat during surgery. If this causes discomfort, gargle with warm salt water. The discomfort should disappear within 24 hours.  *May have Tylenol have 3pm *May have Ibuprofen after 5pm    Call your surgeon if you experience:   1.  Fever over 101.0. 2.  Inability to urinate. 3.  Nausea and/or vomiting. 4.  Extreme swelling or bruising at the surgical site. 5.  Continued bleeding from the incision. 6.  Increased pain, redness or drainage from the incision. 7.  Problems related to your pain medication. 8.  Any problems and/or concerns

## 2019-06-01 NOTE — Interval H&P Note (Signed)
History and Physical Interval Note:  06/01/2019 9:19 AM  Lydia Joyce  has presented today for surgery, with the diagnosis of LEFT BREAST PAPILLOMA.  The various methods of treatment have been discussed with the patient and family. After consideration of risks, benefits and other options for treatment, the patient has consented to  Procedure(s): LEFT BREAST LUMPECTOMY WITH RADIOACTIVE SEED LOCALIZATION (Left) as a surgical intervention.  The patient's history has been reviewed, patient examined, no change in status, stable for surgery.  I have reviewed the patient's chart and labs.  Questions were answered to the patient's satisfaction.     [name]

## 2019-06-02 ENCOUNTER — Encounter: Payer: Self-pay | Admitting: *Deleted

## 2019-06-02 LAB — SURGICAL PATHOLOGY

## 2019-07-19 ENCOUNTER — Telehealth: Payer: Self-pay | Admitting: Family Medicine

## 2019-07-19 DIAGNOSIS — M25511 Pain in right shoulder: Secondary | ICD-10-CM

## 2019-07-19 NOTE — Telephone Encounter (Signed)
Patient stated that she is experiencing some pain still in the right arm and wanted to see if she should should be seen or if Dr. Loletha Grayer could put an order to have a  Xray done, please advise. Patient has an appointment scheduled on 7/14. CB is [phone]

## 2019-07-19 NOTE — Telephone Encounter (Signed)
Saw pt in 03/2019 for Rt shoulder pain. Will place order for shoulder xray and she can schedule appt to have it done

## 2019-07-19 NOTE — Telephone Encounter (Signed)
Does the pt have to be seen first? Please see message and advise.  Thank you.

## 2019-07-20 NOTE — Telephone Encounter (Signed)
Please advise the pt that she would have to have a visit to be evaluated.  It can be virtual, if pt doesn't want to come in.  Thank you in advance.

## 2019-07-20 NOTE — Telephone Encounter (Signed)
Patient called back and wanted to see if she can get a MRI for her right shoulder and hand. Patient stated that she is having shooting pain in her neck all the way down to her arm and hand, please advise. CB is [phone]

## 2019-07-25 DIAGNOSIS — M25511 Pain in right shoulder: Secondary | ICD-10-CM | POA: Diagnosis not present

## 2019-07-25 DIAGNOSIS — M542 Cervicalgia: Secondary | ICD-10-CM | POA: Diagnosis not present

## 2019-07-27 ENCOUNTER — Other Ambulatory Visit: Payer: Self-pay

## 2019-07-27 ENCOUNTER — Ambulatory Visit: Payer: Medicare HMO | Admitting: Family Medicine

## 2019-07-27 DIAGNOSIS — E039 Hypothyroidism, unspecified: Secondary | ICD-10-CM

## 2019-07-27 MED ORDER — LEVOTHYROXINE SODIUM 50 MCG PO TABS: 50.0000 ug | ORAL_TABLET | Freq: Every day | ORAL | 2 refills | Status: DC

## 2019-07-27 NOTE — Telephone Encounter (Signed)
Last OV 04/08/19 Last fill 04/29/19  #30/2

## 2019-08-01 DIAGNOSIS — M542 Cervicalgia: Secondary | ICD-10-CM | POA: Diagnosis not present

## 2019-08-01 DIAGNOSIS — M25511 Pain in right shoulder: Secondary | ICD-10-CM | POA: Diagnosis not present

## 2019-08-08 DIAGNOSIS — M25511 Pain in right shoulder: Secondary | ICD-10-CM | POA: Diagnosis not present

## 2019-08-15 DIAGNOSIS — M6281 Muscle weakness (generalized): Secondary | ICD-10-CM | POA: Diagnosis not present

## 2019-08-15 DIAGNOSIS — M542 Cervicalgia: Secondary | ICD-10-CM | POA: Diagnosis not present

## 2019-08-15 DIAGNOSIS — M25511 Pain in right shoulder: Secondary | ICD-10-CM | POA: Diagnosis not present

## 2019-08-23 DIAGNOSIS — M542 Cervicalgia: Secondary | ICD-10-CM | POA: Diagnosis not present

## 2019-08-23 DIAGNOSIS — M25511 Pain in right shoulder: Secondary | ICD-10-CM | POA: Diagnosis not present

## 2019-08-29 DIAGNOSIS — M25511 Pain in right shoulder: Secondary | ICD-10-CM | POA: Diagnosis not present

## 2019-09-26 ENCOUNTER — Other Ambulatory Visit: Payer: Self-pay | Admitting: Family Medicine

## 2019-09-26 DIAGNOSIS — E039 Hypothyroidism, unspecified: Secondary | ICD-10-CM

## 2019-10-05 ENCOUNTER — Telehealth: Payer: Self-pay | Admitting: Family Medicine

## 2019-10-05 NOTE — Telephone Encounter (Signed)
Please review and advise on message below. Thanks.  Dm/cma  

## 2019-10-05 NOTE — Telephone Encounter (Signed)
Patient notified Via phone and is okay with waiting till January.  Dm/cma

## 2019-10-05 NOTE — Telephone Encounter (Signed)
Patient scheduled CPE in January (next available) but she states she was supposed to have blood work every six months and the last time she had it was December 2020. She is requesting to have her blood work done asap. Please advise.

## 2019-10-05 NOTE — Telephone Encounter (Signed)
pts thyroid function, cholesterol, liver function were checked in 01/2019 and were normal. These can be checked annually. Kidney function and electrolytes were checked in 01/2019 and again in 05/2019. January should be fine to recheck.

## 2019-10-18 DIAGNOSIS — Z0100 Encounter for examination of eyes and vision without abnormal findings: Secondary | ICD-10-CM | POA: Diagnosis not present

## 2019-10-24 DIAGNOSIS — R69 Illness, unspecified: Secondary | ICD-10-CM | POA: Diagnosis not present

## 2019-10-25 DIAGNOSIS — R69 Illness, unspecified: Secondary | ICD-10-CM | POA: Diagnosis not present

## 2019-10-31 DIAGNOSIS — R922 Inconclusive mammogram: Secondary | ICD-10-CM | POA: Diagnosis not present

## 2019-10-31 LAB — HM MAMMOGRAPHY

## 2019-11-04 ENCOUNTER — Encounter: Payer: Self-pay | Admitting: Family Medicine

## 2019-11-06 ENCOUNTER — Other Ambulatory Visit: Payer: Self-pay | Admitting: Family Medicine

## 2019-11-06 DIAGNOSIS — I10 Essential (primary) hypertension: Secondary | ICD-10-CM

## 2019-11-08 ENCOUNTER — Other Ambulatory Visit: Payer: Self-pay

## 2019-11-08 ENCOUNTER — Telehealth: Payer: Self-pay

## 2019-11-08 ENCOUNTER — Ambulatory Visit (INDEPENDENT_AMBULATORY_CARE_PROVIDER_SITE_OTHER): Payer: Medicare HMO

## 2019-11-08 ENCOUNTER — Encounter: Payer: Self-pay | Admitting: Family

## 2019-11-08 ENCOUNTER — Ambulatory Visit (INDEPENDENT_AMBULATORY_CARE_PROVIDER_SITE_OTHER): Payer: Medicare HMO | Admitting: Family

## 2019-11-08 VITALS — BP 126/78 | HR 62 | Temp 97.9°F | Ht 68.0 in | Wt 167.0 lb

## 2019-11-08 VITALS — BP 140/68 | HR 49 | Temp 97.1°F | Resp 16 | Ht 68.0 in | Wt 165.4 lb

## 2019-11-08 DIAGNOSIS — M79644 Pain in right finger(s): Secondary | ICD-10-CM

## 2019-11-08 DIAGNOSIS — M19049 Primary osteoarthritis, unspecified hand: Secondary | ICD-10-CM | POA: Diagnosis not present

## 2019-11-08 DIAGNOSIS — M19041 Primary osteoarthritis, right hand: Secondary | ICD-10-CM | POA: Diagnosis not present

## 2019-11-08 DIAGNOSIS — Z0000 Encounter for general adult medical examination without abnormal findings: Secondary | ICD-10-CM

## 2019-11-08 MED ORDER — DICLOFENAC SODIUM 1 % EX GEL: 4.0000 g | Freq: Four times a day (QID) | CUTANEOUS | 1 refills | Status: DC

## 2019-11-08 MED ORDER — DICLOFENAC SODIUM 1 % EX GEL: 4.0000 g | Freq: Four times a day (QID) | CUTANEOUS | 0 refills | Status: DC

## 2019-11-08 NOTE — Telephone Encounter (Signed)
Patient is requesting a referral to a different GYN. She last saw Dr. Elly Modena but would like to see someone different. She would prefer to go to a totally different practice. She also states that her systolic B/P always runs a little high and she would like to start coming into the office monthly for B/P checks.

## 2019-11-08 NOTE — Patient Instructions (Signed)
Arthritis Arthritis means joint pain. It can also mean joint disease. A joint is a place where bones come together. There are more than 100 types of arthritis. What are the causes? This condition may be caused by:  Wear and tear of a joint. This is the most common cause.  A lot of acid in the blood, which leads to pain in the joint (gout).  Pain and swelling (inflammation) in a joint.  Infection of a joint.  Injuries in the joint.  A reaction to medicines (allergy). In some cases, the cause may not be known. What are the signs or symptoms? Symptoms of this condition include:  Redness at a joint.  Swelling at a joint.  Stiffness at a joint.  Warmth coming from the joint.  A fever.  A feeling of being sick. How is this treated? This condition may be treated with:  Treating the cause, if it is known.  Rest.  Raising (elevating) the joint.  Putting cold or hot packs on the joint.  Medicines to treat symptoms and reduce pain and swelling.  Shots of medicines (cortisone) into the joint. You may also be told to make changes in your life, such as doing exercises and losing weight. Follow these instructions at home: Medicines  Take over-the-counter and prescription medicines only as told by your doctor.  Do not take aspirin for pain if your doctor says that you may have gout. Activity  Rest your joint if your doctor tells you to.  Avoid activities that make the pain worse.  Exercise your joint regularly as told by your doctor. Try doing exercises like: ? Swimming. ? Water aerobics. ? Biking. ? Walking. Managing pain, stiffness, and swelling      If told, put ice on the affected area. ? Put ice in a plastic bag. ? Place a towel between your skin and the bag. ? Leave the ice on for 20 minutes, 2-3 times per day.  If your joint is swollen, raise (elevate) it above the level of your heart if told by your doctor.  If your joint feels stiff in the morning,  try taking a warm shower.  If told, put heat on the affected area. Do this as often as told by your doctor. Use the heat source that your doctor recommends, such as a moist heat pack or a heating pad. If you have diabetes, do not apply heat without asking your doctor. To apply heat: ? Place a towel between your skin and the heat source. ? Leave the heat on for 20-30 minutes. ? Remove the heat if your skin turns bright red. This is very important if you are unable to feel pain, heat, or cold. You may have a greater risk of getting burned. General instructions  Do not use any products that contain nicotine or tobacco, such as cigarettes, e-cigarettes, and chewing tobacco. If you need help quitting, ask your doctor.  Keep all follow-up visits as told by your doctor. This is important. Contact a doctor if:  The pain gets worse.  You have a fever. Get help right away if:  You have very bad pain in your joint.  You have swelling in your joint.  Your joint is red.  Many joints become painful and swollen.  You have very bad back pain.  Your leg is very weak.  You cannot control your pee (urine) or poop (stool). Summary  Arthritis means joint pain. It can also mean joint disease. A joint is a place   where bones come together.  The most common cause of this condition is wear and tear of a joint.  Symptoms of this condition include redness, swelling, or stiffness of the joint.  This condition is treated with rest, raising the joint, medicines, and putting cold or hot packs on the joint.  Follow your doctor's instructions about medicines, activity, exercises, and other home care treatments. This information is not intended to replace advice given to you by your health care provider. Make sure you discuss any questions you have with your health care provider. Document Revised: 12/07/2017 Document Reviewed: 12/07/2017 [employer]vier Patient Education  2020 [employer]  

## 2019-11-08 NOTE — Patient Instructions (Signed)
Lydia Joyce , Thank you for taking time to come for your Medicare Wellness Visit. I appreciate your ongoing commitment to your health goals. Please review the following plan we discussed and let me know if I can assist you in the future.   Screening recommendations/referrals: Colonoscopy: Completed Cologuard 10/23/2018- Due 10/22/2021 Mammogram: Completed 10/31/2019- Due-10/30/2020 Bone Density: Due- Declined at this time. Please call the office to schedule if you change your mind. Recommended yearly ophthalmology/optometry visit for glaucoma screening and checkup Recommended yearly dental visit for hygiene and checkup  Vaccinations: Influenza vaccine: Due-May obtain vaccine at our office or your local pharmacy Pneumococcal vaccine: Due-May obtain vaccine at our office or your local pharmacy Tdap vaccine: Up to Date- Due-06/02/2020 Shingles vaccine: Discuss with pharmacy   Covid-19:Completed vaccines. Booster due-May obtain at your local pharmacy.  Advanced directives: Please bring a copy for your chart  Conditions/risks identified: See problem list  Next appointment: Follow up in one year for your annual wellness visit    Preventive Care 65 Years and Older, Female Preventive care refers to lifestyle choices and visits with your health care provider that can promote health and wellness. What does preventive care include?  A yearly physical exam. This is also called an annual well check.  Dental exams once or twice a year.  Routine eye exams. Ask your health care provider how often you should have your eyes checked.  Personal lifestyle choices, including:  Daily care of your teeth and gums.  Regular physical activity.  Eating a healthy diet.  Avoiding tobacco and drug use.  Limiting alcohol use.  Practicing safe sex.  Taking low-dose aspirin every day.  Taking vitamin and mineral supplements as recommended by your health care provider. What happens during an annual  well check? The services and screenings done by your health care provider during your annual well check will depend on your age, overall health, lifestyle risk factors, and family history of disease. Counseling  Your health care provider may ask you questions about your:  Alcohol use.  Tobacco use.  Drug use.  Emotional well-being.  Home and relationship well-being.  Sexual activity.  Eating habits.  History of falls.  Memory and ability to understand (cognition).  Work and work Statistician.  Reproductive health. Screening  You may have the following tests or measurements:  Height, weight, and BMI.  Blood pressure.  Lipid and cholesterol levels. These may be checked every 5 years, or more frequently if you are over 43 years old.  Skin check.  Lung cancer screening. You may have this screening every year starting at age 72 if you have a 30-pack-year history of smoking and currently smoke or have quit within the past 15 years.  Fecal occult blood test (FOBT) of the stool. You may have this test every year starting at age 67.  Flexible sigmoidoscopy or colonoscopy. You may have a sigmoidoscopy every 5 years or a colonoscopy every 10 years starting at age 48.  Hepatitis C blood test.  Hepatitis B blood test.  Sexually transmitted disease (STD) testing.  Diabetes screening. This is done by checking your blood sugar (glucose) after you have not eaten for a while (fasting). You may have this done every 1-3 years.  Bone density scan. This is done to screen for osteoporosis. You may have this done starting at age 28.  Mammogram. This may be done every 1-2 years. Talk to your health care provider about how often you should have regular mammograms. Talk with your health  care provider about your test results, treatment options, and if necessary, the need for more tests. Vaccines  Your health care provider may recommend certain vaccines, such as:  Influenza vaccine. This  is recommended every year.  Tetanus, diphtheria, and acellular pertussis (Tdap, Td) vaccine. You may need a Td booster every 10 years.  Zoster vaccine. You may need this after age 68.  Pneumococcal 13-valent conjugate (PCV13) vaccine. One dose is recommended after age 91.  Pneumococcal polysaccharide (PPSV23) vaccine. One dose is recommended after age 47. Talk to your health care provider about which screenings and vaccines you need and how often you need them. This information is not intended to replace advice given to you by your health care provider. Make sure you discuss any questions you have with your health care provider. Document Released: 01/26/2015 Document Revised: 09/19/2015 Document Reviewed: 10/31/2014 Elsevier Interactive Patient Education  2017 [employer] Prevention in the Home Falls can cause injuries. They can happen to people of all ages. There are many things you can do to make your home safe and to help prevent falls. What can I do on the outside of my home?  Regularly fix the edges of walkways and driveways and fix any cracks.  Remove anything that might make you trip as you walk through a door, such as a raised step or threshold.  Trim any bushes or trees on the path to your home.  Use bright outdoor lighting.  Clear any walking paths of anything that might make someone trip, such as rocks or tools.  Regularly check to see if handrails are loose or broken. Make sure that both sides of any steps have handrails.  Any raised decks and porches should have guardrails on the edges.  Have any leaves, snow, or ice cleared regularly.  Use sand or salt on walking paths during winter.  Clean up any spills in your garage right away. This includes oil or grease spills. What can I do in the bathroom?  Use night lights.  Install grab bars by the toilet and in the tub and shower. Do not use towel bars as grab bars.  Use non-skid mats or decals in the tub or  shower.  If you need to sit down in the shower, use a plastic, non-slip stool.  Keep the floor dry. Clean up any water that spills on the floor as soon as it happens.  Remove soap buildup in the tub or shower regularly.  Attach bath mats securely with double-sided non-slip rug tape.  Do not have throw rugs and other things on the floor that can make you trip. What can I do in the bedroom?  Use night lights.  Make sure that you have a light by your bed that is easy to reach.  Do not use any sheets or blankets that are too big for your bed. They should not hang down onto the floor.  Have a firm chair that has side arms. You can use this for support while you get dressed.  Do not have throw rugs and other things on the floor that can make you trip. What can I do in the kitchen?  Clean up any spills right away.  Avoid walking on wet floors.  Keep items that you use a lot in easy-to-reach places.  If you need to reach something above you, use a strong step stool that has a grab bar.  Keep electrical cords out of the way.  Do not use floor  polish or wax that makes floors slippery. If you must use wax, use non-skid floor wax.  Do not have throw rugs and other things on the floor that can make you trip. What can I do with my stairs?  Do not leave any items on the stairs.  Make sure that there are handrails on both sides of the stairs and use them. Fix handrails that are broken or loose. Make sure that handrails are as long as the stairways.  Check any carpeting to make sure that it is firmly attached to the stairs. Fix any carpet that is loose or worn.  Avoid having throw rugs at the top or bottom of the stairs. If you do have throw rugs, attach them to the floor with carpet tape.  Make sure that you have a light switch at the top of the stairs and the bottom of the stairs. If you do not have them, ask someone to add them for you. What else can I do to help prevent  falls?  Wear shoes that:  Do not have high heels.  Have rubber bottoms.  Are comfortable and fit you well.  Are closed at the toe. Do not wear sandals.  If you use a stepladder:  Make sure that it is fully opened. Do not climb a closed stepladder.  Make sure that both sides of the stepladder are locked into place.  Ask someone to hold it for you, if possible.  Clearly mark and make sure that you can see:  Any grab bars or handrails.  First and last steps.  Where the edge of each step is.  Use tools that help you move around (mobility aids) if they are needed. These include:  Canes.  Walkers.  Scooters.  Crutches.  Turn on the lights when you go into a dark area. Replace any light bulbs as soon as they burn out.  Set up your furniture so you have a clear path. Avoid moving your furniture around.  If any of your floors are uneven, fix them.  If there are any pets around you, be aware of where they are.  Review your medicines with your doctor. Some medicines can make you feel dizzy. This can increase your chance of falling. Ask your doctor what other things that you can do to help prevent falls. This information is not intended to replace advice given to you by your health care provider. Make sure you discuss any questions you have with your health care provider. Document Released: 10/26/2008 Document Revised: 06/07/2015 Document Reviewed: 02/03/2014 Elsevier Interactive Patient Education  2017 [employer].

## 2019-11-08 NOTE — Progress Notes (Signed)
Acute Office Visit  Subjective:    Patient ID: Lydia Joyce, female    DOB: Jul 25, 1950, 69 y.o.   MRN: [id number]  Chief Complaint  Patient presents with  . Acute Visit    pain in the Rt index finger x 3 weeks, no injury that she knows of, wants x-ray.    HPI Patient is in today with c/o right index finger pain x 3 weeks. She denies acute injury but believes this is the hand she had jammed in a car door many years ago. She des endorse texting and typing with that finger. Reports the pain 9/10, worse with bending. Has tried splinting that made the swelling worse.    Past Medical History:  Diagnosis Date  . Arthritis   . Chicken pox   . Hyperlipidemia, mixed   . Hypertension   . Hypothyroidism   . Persistent cough for 3 weeks or longer 02/24/2018  . Thyroid disease   . Vitamin D deficiency     Past Surgical History:  Procedure Laterality Date  . BREAST LUMPECTOMY WITH RADIOACTIVE SEED LOCALIZATION Left 06/01/2019   Procedure: LEFT BREAST LUMPECTOMY WITH RADIOACTIVE SEED LOCALIZATION;  Surgeon: Jovita Kussmaul, MD;  Location: [facility];  Service: General;  Laterality: Left;  . HERNIA REPAIR     umbilical    Family History  Problem Relation Age of Onset  . Arthritis Mother   . Stroke Mother   . Arthritis Father   . Stroke Father   . Hypertension Father   . Thyroid disease Sister     Social History   Socioeconomic History  . Marital status: Divorced    Spouse name: Not on file  . Number of children: Not on file  . Years of education: Not on file  . Highest education level: Not on file  Occupational History  . Occupation: retired  Tobacco Use  . Smoking status: Never Smoker  . Smokeless tobacco: Never Used  Substance and Sexual Activity  . Alcohol use: No  . Drug use: No  . Sexual activity: Not Currently    Partners: Male  Other Topics Concern  . Not on file  Social History Narrative   Exercise-- walk, treadmill 2x a week   Social  Determinants of Health   Financial Resource Strain: Low Risk   . Difficulty of Paying Living Expenses: Not hard at all  Food Insecurity: No Food Insecurity  . Worried About Charity fundraiser in the Last Year: Never true  . Ran Out of Food in the Last Year: Never true  Transportation Needs: No Transportation Needs  . Lack of Transportation (Medical): No  . Lack of Transportation (Non-Medical): No  Physical Activity: Sufficiently Active  . Days of Exercise per Week: 5 days  . Minutes of Exercise per Session: 30 min  Stress: No Stress Concern Present  . Feeling of Stress : Not at all  Social Connections: Moderately Integrated  . Frequency of Communication with Friends and Family: More than three times a week  . Frequency of Social Gatherings with Friends and Family: More than three times a week  . Attends Religious Services: More than 4 times per year  . Active Member of Clubs or Organizations: Yes  . Attends Archivist Meetings: More than 4 times per year  . Marital Status: Divorced  Human resources officer Violence: Not At Risk  . Fear of Current or Ex-Partner: No  . Emotionally Abused: No  . Physically Abused: No  .  Sexually Abused: No    Outpatient Medications Prior to Visit  Medication Sig Dispense Refill  . Ascorbic Acid (VITAMIN C) 1000 MG tablet Take 1,000 mg by mouth daily.    Marland Kitchen aspirin EC 81 MG tablet Take 81 mg by mouth 2 (two) times a week.     . Cholecalciferol (VITAMIN D3) 5000 UNITS CAPS Take by mouth.    . diclofenac Sodium (VOLTAREN) 1 % GEL Apply 4 g topically 4 (four) times daily. 100 g 0  . Ferrous Sulfate (IRON) 325 (65 FE) MG TABS Take 1 tablet by mouth daily as needed.     . hydrochlorothiazide (HYDRODIURIL) 25 MG tablet Take 1 tablet by mouth once daily 90 tablet 0  . Misc Natural Products (GLUCOSAMINE CHOND COMPLEX/MSM) TABS Take 1 tablet by mouth daily.    . Multiple Vitamin (MULTIVITAMIN) tablet Take 1 tablet by mouth daily.    . Vitamin D,  Ergocalciferol, (DRISDOL) 1.25 MG (50000 UT) CAPS capsule Take 1 capsule (50,000 Units total) by mouth every 7 (seven) days. 12 capsule 0  . Zinc Acetate, Oral, (ZINC ACETATE PO) Take 5 mg by mouth daily.    Marland Kitchen levothyroxine (SYNTHROID) 50 MCG tablet TAKE 1 TABLET BY MOUTH EVERY DAY 30 tablet 0   No facility-administered medications prior to visit.    Allergies  Allergen Reactions  . Lisinopril Swelling    Lip swelling    Review of Systems  Musculoskeletal:       Right index finger pain and swelling  Neurological: Negative.   Hematological: Negative.   All other systems reviewed and are negative.      Objective:    Physical Exam Vitals reviewed.  Constitutional:      Appearance: She is normal weight.  Cardiovascular:     Rate and Rhythm: Normal rate and regular rhythm.     Pulses: Normal pulses.     Heart sounds: Normal heart sounds.  Pulmonary:     Effort: Pulmonary effort is normal.     Breath sounds: Normal breath sounds.  Musculoskeletal:     Right hand: Swelling and tenderness present. No deformity. Normal strength.     Cervical back: Normal range of motion.     Comments: Right index finger PIP joint swollen, tender, painful with flexion.   Skin:    General: Skin is warm and dry.     Capillary Refill: Capillary refill takes less than 2 seconds.  Neurological:     General: No focal deficit present.     Mental Status: She is alert and oriented to person, place, and time.  Psychiatric:        Mood and Affect: Mood normal.        Behavior: Behavior normal.     BP 126/78   Pulse 62   Temp 97.9 F (36.6 C) (Temporal)   Ht 5\' 8"  (1.727 m)   Wt 167 lb (75.8 kg)   SpO2 98%   BMI 25.39 kg/m  Wt Readings from Last 3 Encounters:  11/08/19 167 lb (75.8 kg)  11/08/19 165 lb 6.4 oz (75 kg)  06/01/19 166 lb 0.1 oz (75.3 kg)    Health Maintenance Due  Topic Date Due  . PNA vac Low Risk Adult (1 of 2 - PCV13) Never done  . PAP SMEAR-Modifier  03/11/2018  .  INFLUENZA VACCINE  08/14/2019    There are no preventive care reminders to display for this patient.   Lab Results  Component Value Date   TSH 1.66  01/25/2019   Lab Results  Component Value Date   WBC 3.9 (L) 10/04/2018   HGB 12.8 10/04/2018   HCT 37.9 10/04/2018   MCV 86.9 10/04/2018   PLT 243.0 10/04/2018   Lab Results  Component Value Date   NA 138 05/25/2019   K 3.8 05/25/2019   CO2 29 05/25/2019   GLUCOSE 98 05/25/2019   BUN 17 05/25/2019   CREATININE 1.18 (H) 05/25/2019   BILITOT 0.5 01/25/2019   ALKPHOS 79 01/25/2019   AST 22 01/25/2019   ALT 19 01/25/2019   PROT 6.8 01/25/2019   ALBUMIN 4.1 01/25/2019   CALCIUM 9.5 05/25/2019   ANIONGAP 9 05/25/2019   GFR 57.06 (L) 01/25/2019   Lab Results  Component Value Date   CHOL 175 10/04/2018   Lab Results  Component Value Date   HDL 44.70 10/04/2018   Lab Results  Component Value Date   LDLCALC 114 (H) 10/04/2018   Lab Results  Component Value Date   TRIG 82.0 10/04/2018   Lab Results  Component Value Date   CHOLHDL 4 10/04/2018   Lab Results  Component Value Date   HGBA1C 5.7 10/03/2016       Assessment & Plan:    Lydia Joyce was seen today for acute visit.  Diagnoses and all orders for this visit:  Finger pain, right -     Cancel: DG Finger Index Right -     DG Finger Index Right  Arthritis of finger  Other orders -     Discontinue: diclofenac Sodium (VOLTAREN) 1 % GEL; Apply 4 g topically 4 (four) times daily. -     diclofenac Sodium (VOLTAREN) 1 % GEL; Apply 4 g topically 4 (four) times daily.    Consider steroid injection to the MIP joint if no better in 2-3 weeks.  Kennyth Arnold, FNP

## 2019-11-08 NOTE — Progress Notes (Signed)
Subjective:   Lydia Joyce is a 69 y.o. female who presents for an Initial Medicare Annual Wellness Visit.  Review of Systems     Cardiac Risk Factors include: advanced age (>23men, >77 women);hypertension;dyslipidemia     Objective:    Today's Vitals   11/08/19 0938 11/08/19 0941  BP: 140/68   Pulse: (!) 49   Resp: 16   Temp: (!) 97.1 F (36.2 C)   TempSrc: Temporal   SpO2: 98%   Weight: 165 lb 6.4 oz (75 kg)   Height: 5\' 8"  (1.727 m)   PainSc:  9    Body mass index is 25.15 kg/m.  Advanced Directives 11/08/2019 06/01/2019 05/24/2019  Does Patient Have a Medical Advance Directive? Yes Yes Yes  Type of Paramedic of [place];Living will [place] will  Does patient want to make changes to medical advance directive? - No - Patient declined No - Patient declined  Copy of [place] in Chart? No - copy requested No - copy requested -  Would patient like information on creating a medical advance directive? - - No - Patient declined    Current Medications (verified) Outpatient Encounter Medications as of 11/08/2019  Medication Sig  . Ascorbic Acid (VITAMIN C) 1000 MG tablet Take 1,000 mg by mouth daily.  Marland Kitchen aspirin EC 81 MG tablet Take 81 mg by mouth 2 (two) times a week.   . Cholecalciferol (VITAMIN D3) 5000 UNITS CAPS Take by mouth.  . Ferrous Sulfate (IRON) 325 (65 FE) MG TABS Take 1 tablet by mouth daily as needed.   . hydrochlorothiazide (HYDRODIURIL) 25 MG tablet Take 1 tablet by mouth once daily  . Misc Natural Products (GLUCOSAMINE CHOND COMPLEX/MSM) TABS Take 1 tablet by mouth daily.  . Multiple Vitamin (MULTIVITAMIN) tablet Take 1 tablet by mouth daily.  . Vitamin D, Ergocalciferol, (DRISDOL) 1.25 MG (50000 UT) CAPS capsule Take 1 capsule (50,000 Units total) by mouth every 7 (seven) days.  . Zinc Acetate, Oral, (ZINC ACETATE PO) Take 5 mg by mouth daily.  . diclofenac Sodium (VOLTAREN) 1 % GEL  Apply 4 g topically 4 (four) times daily. (Patient not taking: Reported on 11/08/2019)  . levothyroxine (SYNTHROID) 50 MCG tablet TAKE 1 TABLET BY MOUTH EVERY DAY  . [DISCONTINUED] HYDROcodone-acetaminophen (NORCO/VICODIN) 5-325 MG tablet Take 1-2 tablets by mouth every 6 (six) hours as needed for moderate pain or severe pain.   No facility-administered encounter medications on file as of 11/08/2019.    Allergies (verified) Lisinopril   History: Past Medical History:  Diagnosis Date  . Arthritis   . Chicken pox   . Hyperlipidemia, mixed   . Hypertension   . Hypothyroidism   . Persistent cough for 3 weeks or longer 02/24/2018  . Thyroid disease   . Vitamin D deficiency    Past Surgical History:  Procedure Laterality Date  . BREAST LUMPECTOMY WITH RADIOACTIVE SEED LOCALIZATION Left 06/01/2019   Procedure: LEFT BREAST LUMPECTOMY WITH RADIOACTIVE SEED LOCALIZATION;  Surgeon: Jovita Kussmaul, MD;  Location: [facility];  Service: General;  Laterality: Left;  . HERNIA REPAIR     umbilical   Family History  Problem Relation Age of Onset  . Arthritis Mother   . Stroke Mother   . Arthritis Father   . Stroke Father   . Hypertension Father   . Thyroid disease Sister    Social History   Socioeconomic History  . Marital status: Divorced  Spouse name: Not on file  . Number of children: Not on file  . Years of education: Not on file  . Highest education level: Not on file  Occupational History  . Occupation: retired  Tobacco Use  . Smoking status: Never Smoker  . Smokeless tobacco: Never Used  Substance and Sexual Activity  . Alcohol use: No  . Drug use: No  . Sexual activity: Not Currently    Partners: Male  Other Topics Concern  . Not on file  Social History Narrative   Exercise-- walk, treadmill 2x a week   Social Determinants of Health   Financial Resource Strain: Low Risk   . Difficulty of Paying Living Expenses: Not hard at all  Food Insecurity: No  Food Insecurity  . Worried About Charity fundraiser in the Last Year: Never true  . Ran Out of Food in the Last Year: Never true  Transportation Needs: No Transportation Needs  . Lack of Transportation (Medical): No  . Lack of Transportation (Non-Medical): No  Physical Activity: Sufficiently Active  . Days of Exercise per Week: 5 days  . Minutes of Exercise per Session: 30 min  Stress: No Stress Concern Present  . Feeling of Stress : Not at all  Social Connections: Moderately Integrated  . Frequency of Communication with Friends and Family: More than three times a week  . Frequency of Social Gatherings with Friends and Family: More than three times a week  . Attends Religious Services: More than 4 times per year  . Active Member of Clubs or Organizations: Yes  . Attends Archivist Meetings: More than 4 times per year  . Marital Status: Divorced    Tobacco Counseling Counseling given: Not Answered   Clinical Intake:  Pre-visit preparation completed: Yes  Pain : 0-10 Pain Score: 9  Pain Type: Acute pain Pain Location: Finger (Comment which one) Pain Onset: 1 to 4 weeks ago Pain Frequency: Constant     Nutritional Status: BMI 25 -29 Overweight Nutritional Risks: None Diabetes: No  How often do you need to have someone help you when you read instructions, pamphlets, or other written materials from your doctor or pharmacy?: 1 - Never What is the last grade level you completed in school?: Graduate school  Diabetic?No  Interpreter Needed?: No  Information entered by :: Caroleen Hamman LPN   Activities of Daily Living In your present state of health, do you have any difficulty performing the following activities: 11/08/2019 06/01/2019  Hearing? N N  Vision? N N  Difficulty concentrating or making decisions? N N  Walking or climbing stairs? N N  Dressing or bathing? N N  Doing errands, shopping? N -  Preparing Food and eating ? N -  Using the Toilet? N -    In the past six months, have you accidently leaked urine? N -  Do you have problems with loss of bowel control? N -  Managing your Medications? N -  Managing your Finances? N -  Housekeeping or managing your Housekeeping? N -  Some recent data might be hidden    Patient Care Team: Ronnald Nian, DO as PCP - General (Family Medicine) Elouise Munroe, MD as PCP - Cardiology (Cardiology)  Indicate any recent Medical Services you may have received from other than [facility] providers in the past year (date may be approximate).     Assessment:   This is a routine wellness examination for [place].  Hearing/Vision screen  Hearing Screening  125Hz  250Hz  500Hz  1000Hz  2000Hz  3000Hz  4000Hz  6000Hz  8000Hz   Right ear:           Left ear:           Comments: NO ISSUES  Vision Screening Comments: Last eye exam-09/2019- [facility]  Dietary issues and exercise activities discussed: Current Exercise Habits: Home exercise routine, Type of exercise: walking, Time (Minutes): 30, Frequency (Times/Week): 5, Weekly Exercise (Minutes/Week): 150, Intensity: Mild, Exercise limited by: None identified  Goals    . Patient Stated     Work on Blood pressure control      Depression Screen PHQ 2/9 Scores 11/08/2019 04/08/2019 02/24/2018 02/24/2017  PHQ - 2 Score 0 0 0 0    Fall Risk Fall Risk  11/08/2019 04/08/2019 02/24/2018 02/24/2017  Falls in the past year? 0 0 0 No  Number falls in past yr: 0 - - -  Injury with Fall? 0 - - -  Follow up Falls prevention discussed - Falls evaluation completed -    Any stairs in or around the home? Yes  If so, are there any without handrails? No  Home free of loose throw rugs in walkways, pet beds, electrical cords, etc? Yes  Adequate lighting in your home to reduce risk of falls? Yes   ASSISTIVE DEVICES UTILIZED TO PREVENT FALLS:  Life alert? No  Use of a cane, walker or w/c? No  Grab bars in the bathroom? Yes  Shower chair or bench in shower? No  Elevated  toilet seat or a handicapped toilet? No   TIMED UP AND GO:  Was the test performed? Yes .  Length of time to ambulate 10 feet: 9 sec.   Gait steady and fast without use of assistive device  Cognitive Function:No cognitive impairment noted        Immunizations Immunization History  Administered Date(s) Administered  . Fluad Quad(high Dose 65+) 10/04/2018  . Influenza, High Dose Seasonal PF 11/26/2017  . Influenza, Quadrivalent, Recombinant, Inj, Pf 11/27/2016  . Influenza,inj,Quad PF,6+ Mos 11/22/2015  . Influenza-Unspecified 10/10/2013, 09/14/2014  . PFIZER SARS-COV-2 Vaccination 02/26/2019, 03/22/2019  . Td 02/08/2004  . Tdap 06/03/2010    TDAP status: Up to date   Flu Vaccine status: Declined, Education has been provided regarding the importance of this vaccine but patient still declined. Advised may receive this vaccine at local pharmacy or Health Dept. Aware to provide a copy of the vaccination record if obtained from local pharmacy or Health Dept. Verbalized acceptance and understanding.   Pneumococcal vaccine status: Declined,  Education has been provided regarding the importance of this vaccine but patient still declined. Advised may receive this vaccine at local pharmacy or Health Dept. Aware to provide a copy of the vaccination record if obtained from local pharmacy or Health Dept. Verbalized acceptance and understanding.    Covid-19 vaccine status: Completed vaccines  Qualifies for Shingles Vaccine? Yes   Zostavax completed No   Shingrix Completed?: No.    Education has been provided regarding the importance of this vaccine. Patient has been advised to call insurance company to determine out of pocket expense if they have not yet received this vaccine. Advised may also receive vaccine at local pharmacy or Health Dept. Verbalized acceptance and understanding.  Screening Tests Health Maintenance  Topic Date Due  . PNA vac Low Risk Adult (1 of 2 - PCV13) Never done    . PAP SMEAR-Modifier  03/11/2018  . INFLUENZA VACCINE  08/14/2019  . TETANUS/TDAP  06/02/2020  . MAMMOGRAM  10/30/2020  . Fecal DNA (Cologuard)  10/22/2021  . DEXA SCAN  Completed  . COVID-19 Vaccine  Completed  . Hepatitis C Screening  Completed    Health Maintenance  Health Maintenance Due  Topic Date Due  . PNA vac Low Risk Adult (1 of 2 - PCV13) Never done  . PAP SMEAR-Modifier  03/11/2018  . INFLUENZA VACCINE  08/14/2019    Colorectal cancer screening: Completed Cologuard 1010/2020. Repeat every 3 years   Mammogram status: Completed 10/31/2019. Repeat every year   Bone Density status: Declined  Lung Cancer Screening: (Low Dose CT Chest recommended if Age 17-80 years, 30 pack-year currently smoking OR have quit w/in 15years.) does not qualify.    Additional Screening:  Hepatitis C Screening:  Completed 03/27/2015  Vision Screening: Recommended annual ophthalmology exams for early detection of glaucoma and other disorders of the eye. Is the patient up to date with their annual eye exam?  Yes  Who is the provider or what is the name of the office in which the patient attends annual eye exams? [facility]   Dental Screening: Recommended annual dental exams for proper oral hygiene  Community Resource Referral / Chronic Care Management: CRR required this visit?  No   CCM required this visit?  No      Plan:     I have personally reviewed and noted the following in the patient's chart:   . Medical and social history . Use of alcohol, tobacco or illicit drugs  . Current medications and supplements . Functional ability and status . Nutritional status . Physical activity . Advanced directives . List of other physicians . Hospitalizations, surgeries, and ER visits in previous 12 months . Vitals . Screenings to include cognitive, depression, and falls . Referrals and appointments  In addition, I have reviewed and discussed with patient certain preventive  protocols, quality metrics, and best practice recommendations. A written personalized care plan for preventive services as well as general preventive health recommendations were provided to patient.     Marta Antu, LPN   [id number]  Nurse Health Advisor  Nurse Notes: Patient requesting referral to a different GYN.She would also like to come into the office for monthly B/P checks. Message sent to PCP. She also complained of pain in her right index finger & wants to get an x-ray. Appt made with Druscilla Brownie for today at 4:00pm.

## 2019-11-09 NOTE — Telephone Encounter (Signed)
Langley Gauss, can you please call pt with info below? Thanks! For routine GYN care, pt should not need a referral. Here are two practices to which I often direct patients.   [facility]  [email or web address] [address] [place] [address] P: [phone] F: [phone] [email or web address] .[email or web address] [email or web address] [address] [place], [place] [address] fax: [phone] [email or web address]  As for her BP, please call pt to make in-person OV appt for HTN f/u. If BP is elevated, we can add/adjust meds and make a plan for f/u.

## 2019-11-09 NOTE — Telephone Encounter (Signed)
Spoke to patient and gave her the information and she will call and get scheduled with another GYN. She also states that as far as her BP she will watch it and that it hasn't been too high she was just concerned on how at one time it can be 140/76 then another 126/74. I advised about scheduling a f/u appt for BP/meds and she will wait and call back to schedule later if she feels she needs to.   Thanks.   Dm/cma

## 2019-11-17 ENCOUNTER — Telehealth: Payer: Self-pay

## 2019-11-17 NOTE — Telephone Encounter (Signed)
PA for DiclofenacSodium Gel approved from 01/14/19 -01/13/20 by Holland Falling.  Dm/cma

## 2019-12-03 ENCOUNTER — Other Ambulatory Visit: Payer: Self-pay | Admitting: Family Medicine

## 2019-12-03 DIAGNOSIS — E039 Hypothyroidism, unspecified: Secondary | ICD-10-CM

## 2019-12-05 NOTE — Telephone Encounter (Signed)
Last OV 04/08/19 Last fill 09/26/19  #30/0

## 2019-12-08 ENCOUNTER — Other Ambulatory Visit: Payer: Self-pay | Admitting: Family Medicine

## 2019-12-08 DIAGNOSIS — E039 Hypothyroidism, unspecified: Secondary | ICD-10-CM

## 2019-12-26 ENCOUNTER — Other Ambulatory Visit: Payer: Self-pay

## 2019-12-27 ENCOUNTER — Ambulatory Visit (INDEPENDENT_AMBULATORY_CARE_PROVIDER_SITE_OTHER): Payer: Medicare HMO

## 2019-12-27 ENCOUNTER — Encounter: Payer: Self-pay | Admitting: Family Medicine

## 2019-12-27 DIAGNOSIS — Z23 Encounter for immunization: Secondary | ICD-10-CM | POA: Diagnosis not present

## 2019-12-27 NOTE — Progress Notes (Signed)
After obtaining consent, and per orders of Dr. Bryan Lemma, injection of influena given left deltoid by Lynda Rainwater. Patient instructed to remain in clinic for 20 minutes afterwards, and to report any adverse reaction to me immediately.

## 2020-01-25 ENCOUNTER — Ambulatory Visit: Payer: Medicare HMO | Admitting: Obstetrics & Gynecology

## 2020-01-25 ENCOUNTER — Encounter: Payer: Self-pay | Admitting: Obstetrics & Gynecology

## 2020-01-25 ENCOUNTER — Other Ambulatory Visit (HOSPITAL_COMMUNITY)
Admission: RE | Admit: 2020-01-25 | Discharge: 2020-01-25 | Disposition: A | Discharge status: Home / Self Care | Payer: Medicare HMO | Source: Ambulatory Visit | Attending: Obstetrics & Gynecology | Admitting: Obstetrics & Gynecology

## 2020-01-25 ENCOUNTER — Other Ambulatory Visit: Payer: Self-pay

## 2020-01-25 VITALS — BP 146/77 | HR 55 | Wt 165.0 lb

## 2020-01-25 DIAGNOSIS — Z01419 Encounter for gynecological examination (general) (routine) without abnormal findings: Secondary | ICD-10-CM | POA: Insufficient documentation

## 2020-01-25 DIAGNOSIS — Z1151 Encounter for screening for human papillomavirus (HPV): Secondary | ICD-10-CM | POA: Insufficient documentation

## 2020-01-25 DIAGNOSIS — R69 Illness, unspecified: Secondary | ICD-10-CM | POA: Diagnosis not present

## 2020-01-25 DIAGNOSIS — Z1211 Encounter for screening for malignant neoplasm of colon: Secondary | ICD-10-CM

## 2020-01-25 NOTE — Progress Notes (Signed)
Subjective:     Lydia Joyce is a 70 y.o. female here for a routine exam.  Current complaints: none. Pt is from [place]. She has been in the states for >50 years. She is divorced then widowed. Her 2 sons live in [place]. She is worried about risk of cancer.  Her friend died of a malignancy and had a rapid deterioration and she is not sure all of the circumstances. Pt is requesting a PAP smear.   Gynecologic History No LMP recorded. Patient is postmenopausal. Contraception: post menopausal status Last Pap: 2 years prev. Results were: normal Last mammogram: 10/31/2019. Results were: normal  Obstetric History OB History  Gravida Para Term Preterm AB Living  2         2  SAB IAB Ectopic Multiple Live Births          2    # Outcome Date GA Lbr Len/2nd Weight Sex Delivery Anes PTL Lv  2 Gravida      Vag-Spont   LIV  1 Gravida      Vag-Spont   LIV     The following portions of the patient's history were reviewed and updated as appropriate: allergies, current medications, past family history, past medical history, past social history, past surgical history and problem list.  Review of Systems Pertinent items are noted in HPI.    Objective:  BP (!) 146/77   Pulse (!) 55   Wt 165 lb (74.8 kg)   BMI 25.09 kg/m  General Appearance:    Alert, cooperative, no distress, appears stated age  Head:    Normocephalic, without obvious abnormality, atraumatic  Eyes:    conjunctiva/corneas clear, EOM's intact, both eyes  Ears:    Normal external ear canals, both ears  Nose:   Nares normal, septum midline, mucosa normal, no drainage    or sinus tenderness  Throat:   Lips, mucosa, and tongue normal; teeth and gums normal  Neck:   Supple, symmetrical, trachea midline, no adenopathy;    thyroid:  no enlargement/tenderness/nodules  Back:     Symmetric, no curvature, ROM normal, no CVA tenderness  Lungs:     respirations unlabored  Chest Wall:    No tenderness or deformity   Heart:    Regular rate and  rhythm  Breast Exam:    No tenderness, masses, or nipple abnormality  Abdomen:     Soft, non-tender, bowel sounds active all four quadrants,    no masses, no organomegaly  Genitalia:    Normal female without lesion, discharge or tenderness     Extremities:   Extremities normal, atraumatic, no cyanosis or edema  Pulses:   2+ and symmetric all extremities  Skin:   Skin color, texture, turgor normal, no rashes or lesions     Assessment:    Healthy female exam.   Colon cancer screening- last colonoscopy was >10 years prev    Plan:   Rachna was seen today for gynecologic exam.  Diagnoses and all orders for this visit:  Well woman exam with routine gynecological exam -     Cytology - PAP( [facility])  Colon cancer screening -     Ambulatory referral to Gastroenterology  f/u in 1 year or sooner prn    Naevia Unterreiner L. Harraway-Smith, M.D., Cherlynn June

## 2020-01-26 LAB — CYTOLOGY - PAP
Comment: NEGATIVE
Diagnosis: NEGATIVE
High risk HPV: NEGATIVE

## 2020-02-01 ENCOUNTER — Other Ambulatory Visit: Payer: Self-pay

## 2020-02-01 ENCOUNTER — Ambulatory Visit (INDEPENDENT_AMBULATORY_CARE_PROVIDER_SITE_OTHER): Payer: Medicare HMO | Admitting: Family Medicine

## 2020-02-01 ENCOUNTER — Encounter: Payer: Self-pay | Admitting: Family Medicine

## 2020-02-01 VITALS — BP 132/78 | HR 55 | Temp 97.4°F | Ht 67.0 in | Wt 165.6 lb

## 2020-02-01 DIAGNOSIS — I10 Essential (primary) hypertension: Secondary | ICD-10-CM | POA: Diagnosis not present

## 2020-02-01 DIAGNOSIS — E559 Vitamin D deficiency, unspecified: Secondary | ICD-10-CM | POA: Diagnosis not present

## 2020-02-01 DIAGNOSIS — D508 Other iron deficiency anemias: Secondary | ICD-10-CM | POA: Diagnosis not present

## 2020-02-01 DIAGNOSIS — Z13820 Encounter for screening for osteoporosis: Secondary | ICD-10-CM | POA: Diagnosis not present

## 2020-02-01 DIAGNOSIS — Z0000 Encounter for general adult medical examination without abnormal findings: Secondary | ICD-10-CM | POA: Diagnosis not present

## 2020-02-01 DIAGNOSIS — E039 Hypothyroidism, unspecified: Secondary | ICD-10-CM | POA: Diagnosis not present

## 2020-02-01 DIAGNOSIS — E782 Mixed hyperlipidemia: Secondary | ICD-10-CM | POA: Diagnosis not present

## 2020-02-01 LAB — CBC
HCT: 38 % (ref 36.0–46.0)
Hemoglobin: 12.8 g/dL (ref 12.0–15.0)
MCHC: 33.6 g/dL (ref 30.0–36.0)
MCV: 86.4 fl (ref 78.0–100.0)
Platelets: 207 10*3/uL (ref 150.0–400.0)
RBC: 4.4 Mil/uL (ref 3.87–5.11)
RDW: 13.8 % (ref 11.5–15.5)
WBC: 3.9 10*3/uL — ABNORMAL LOW (ref 4.0–10.5)

## 2020-02-01 LAB — COMPREHENSIVE METABOLIC PANEL
ALT: 17 U/L (ref 0–35)
AST: 21 U/L (ref 0–37)
Albumin: 4.6 g/dL (ref 3.5–5.2)
Alkaline Phosphatase: 51 U/L (ref 39–117)
BUN: 14 mg/dL (ref 6–23)
CO2: 32 mEq/L (ref 19–32)
Calcium: 9.9 mg/dL (ref 8.4–10.5)
Chloride: 96 mEq/L (ref 96–112)
Creatinine, Ser: 1.17 mg/dL (ref 0.40–1.20)
GFR: 48.33 mL/min — ABNORMAL LOW (ref >60.00)
Glucose, Bld: 90 mg/dL (ref 70–99)
Potassium: 3.1 mEq/L — ABNORMAL LOW (ref 3.5–5.1)
Sodium: 135 mEq/L (ref 135–145)
Total Bilirubin: 0.9 mg/dL (ref 0.2–1.2)
Total Protein: 7.1 g/dL (ref 6.0–8.3)

## 2020-02-01 LAB — LIPID PANEL
Cholesterol: 183 mg/dL (ref 0–200)
HDL: 48.5 mg/dL (ref >39.00)
LDL Cholesterol: 120 mg/dL — ABNORMAL HIGH (ref 0–99)
NonHDL: 134.16
Total CHOL/HDL Ratio: 4
Triglycerides: 71 mg/dL (ref 0.0–149.0)
VLDL: 14.2 mg/dL (ref 0.0–40.0)

## 2020-02-01 LAB — T4, FREE: Free T4: 1.16 ng/dL (ref 0.60–1.60)

## 2020-02-01 LAB — TSH: TSH: 0.89 u[IU]/mL (ref 0.35–4.50)

## 2020-02-01 LAB — VITAMIN D 25 HYDROXY (VIT D DEFICIENCY, FRACTURES): VITD: 30.92 ng/mL (ref 30.00–100.00)

## 2020-02-01 MED ORDER — LEVOTHYROXINE SODIUM 50 MCG PO TABS: 50.0000 ug | ORAL_TABLET | Freq: Every day | ORAL | 3 refills | Status: DC

## 2020-02-01 MED ORDER — HYDROCHLOROTHIAZIDE 25 MG PO TABS: 25.0000 mg | ORAL_TABLET | Freq: Every day | ORAL | 3 refills | Status: DC

## 2020-02-01 MED ORDER — LEVOTHYROXINE SODIUM 50 MCG PO TABS
50.0000 ug | ORAL_TABLET | Freq: Every day | ORAL | 3 refills | Status: DC
Start: 2020-02-01 — End: 2021-02-11

## 2020-02-01 NOTE — Progress Notes (Signed)
Lydia Joyce is a 70 y.o. female  Chief Complaint  Patient presents with  . Annual Exam    CPE/labs.  No concerns.      DJM:EQAST Lydia Joyce is a 70 y.o. female seen today for annual exam, labs, and f/u on chronic medical conditions including HTN, HLD, hypothyroidism, Vit D deficiency.  She has no issues or concerns.    Last PAP: UTD - 01/2020 - follows with Dr. Ihor Dow Last mammo: 10/2019 - due in 10/2020 Last Dexa: 2011 Last colonoscopy: cologuard done 10/2018 - normal; referral placed to GI for colonoscopy earlier this month by Dr. Ihor Dow  Dental: UTD Vision: UTD  Med refills needed today? Yes - see order   Past Medical History:  Diagnosis Date  . Arthritis   . Chicken pox   . Hyperlipidemia, mixed   . Hypertension   . Hypothyroidism   . Persistent cough for 3 weeks or longer 02/24/2018  . Thyroid disease   . Vitamin D deficiency     Past Surgical History:  Procedure Laterality Date  . BREAST LUMPECTOMY WITH RADIOACTIVE SEED LOCALIZATION Left 06/01/2019   Procedure: LEFT BREAST LUMPECTOMY WITH RADIOACTIVE SEED LOCALIZATION;  Surgeon: Jovita Kussmaul, MD;  Location: [facility];  Service: General;  Laterality: Left;  . HERNIA REPAIR     umbilical    Social History   Socioeconomic History  . Marital status: Divorced    Spouse name: Not on file  . Number of children: Not on file  . Years of education: Not on file  . Highest education level: Not on file  Occupational History  . Occupation: retired  Tobacco Use  . Smoking status: Never Smoker  . Smokeless tobacco: Never Used  Vaping Use  . Vaping Use: Never used  Substance and Sexual Activity  . Alcohol use: No  . Drug use: No  . Sexual activity: Not Currently    Partners: Male  Other Topics Concern  . Not on file  Social History Narrative   Exercise-- walk, treadmill 2x a week   Social Determinants of Health   Financial Resource Strain: Low Risk   . Difficulty of Paying  Living Expenses: Not hard at all  Food Insecurity: No Food Insecurity  . Worried About Charity fundraiser in the Last Year: Never true  . Ran Out of Food in the Last Year: Never true  Transportation Needs: No Transportation Needs  . Lack of Transportation (Medical): No  . Lack of Transportation (Non-Medical): No  Physical Activity: Sufficiently Active  . Days of Exercise per Week: 5 days  . Minutes of Exercise per Session: 30 min  Stress: No Stress Concern Present  . Feeling of Stress : Not at all  Social Connections: Moderately Integrated  . Frequency of Communication with Friends and Family: More than three times a week  . Frequency of Social Gatherings with Friends and Family: More than three times a week  . Attends Religious Services: More than 4 times per year  . Active Member of Clubs or Organizations: Yes  . Attends Archivist Meetings: More than 4 times per year  . Marital Status: Divorced  Human resources officer Violence: Not At Risk  . Fear of Current or Ex-Partner: No  . Emotionally Abused: No  . Physically Abused: No  . Sexually Abused: No    Family History  Problem Relation Age of Onset  . Arthritis Mother   . Stroke Mother   . Arthritis Father   .  Stroke Father   . Hypertension Father   . Thyroid disease Sister      Immunization History  Administered Date(s) Administered  . Fluad Quad(high Dose 65+) 10/04/2018, 12/27/2019  . Influenza, High Dose Seasonal PF 11/26/2017  . Influenza, Quadrivalent, Recombinant, Inj, Pf 11/27/2016  . Influenza,inj,Quad PF,6+ Mos 11/22/2015  . Influenza-Unspecified 10/10/2013, 09/14/2014  . PFIZER(Purple Top)SARS-COV-2 Vaccination 02/26/2019, 03/22/2019, 10/31/2019  . Td 02/08/2004  . Tdap 06/03/2010    Outpatient Encounter Medications as of 02/01/2020  Medication Sig Note  . Ascorbic Acid (VITAMIN C) 1000 MG tablet Take 1,000 mg by mouth daily.   Marland Kitchen aspirin EC 81 MG tablet Take 81 mg by mouth 2 (two) times a week.    .  Cholecalciferol (VITAMIN D3) 5000 UNITS CAPS Take by mouth.   . diclofenac Sodium (VOLTAREN) 1 % GEL Apply 4 g topically 4 (four) times daily.   . Ferrous Sulfate (IRON) 325 (65 FE) MG TABS Take 1 tablet by mouth daily as needed.    . hydrochlorothiazide (HYDRODIURIL) 25 MG tablet Take 1 tablet by mouth once daily   . Misc Natural Products (GLUCOSAMINE CHOND COMPLEX/MSM) TABS Take 1 tablet by mouth daily. 06/26/2014: Pt. reports taking as needed  . Multiple Vitamin (MULTIVITAMIN) tablet Take 1 tablet by mouth daily.   . Zinc Acetate, Oral, (ZINC ACETATE PO) Take 5 mg by mouth daily.   Marland Kitchen levothyroxine (SYNTHROID) 50 MCG tablet TAKE 1 TABLET BY MOUTH EVERY DAY   . [DISCONTINUED] Vitamin D, Ergocalciferol, (DRISDOL) 1.25 MG (50000 UT) CAPS capsule Take 1 capsule (50,000 Units total) by mouth every 7 (seven) days. (Patient not taking: Reported on 01/25/2020)    No facility-administered encounter medications on file as of 02/01/2020.     ROS: Gen: no fever, chills  Skin: no rash, itching ENT: no ear pain, ear drainage, nasal congestion, rhinorrhea, sinus pressure, sore throat Eyes: no blurry vision, double vision Resp: no cough, wheeze,SOB CV: no CP, palpitations, LE edema,  GI: no heartburn, n/v/d/c, abd pain GU: no dysuria, urgency, frequency, hematuria MSK: no joint pain, myalgias, back pain Neuro: no dizziness, headache, weakness, vertigo Psych: no depression, anxiety, insomnia   Allergies  Allergen Reactions  . Lisinopril Swelling    Lip swelling    BP 132/78   Pulse (!) 55   Temp (!) 97.4 F (36.3 C) (Temporal)   Ht 5\' 7"  (1.702 m)   Wt 165 lb 9.6 oz (75.1 kg)   SpO2 99%   BMI 25.94 kg/m    BP Readings from Last 3 Encounters:  02/01/20 132/78  01/25/20 (!) 146/77  11/08/19 126/78   Pulse Readings from Last 3 Encounters:  02/01/20 (!) 55  01/25/20 (!) 55  11/08/19 62   Wt Readings from Last 3 Encounters:  02/01/20 165 lb 9.6 oz (75.1 kg)  01/25/20 165 lb (74.8 kg)   11/08/19 167 lb (75.8 kg)    Physical Exam Constitutional:      General: She is not in acute distress.    Appearance: She is well-developed and well-nourished.  HENT:     Head: Normocephalic and atraumatic.     Right Ear: Tympanic membrane and ear canal normal.     Left Ear: Tympanic membrane and ear canal normal.     Nose: Nose normal.     Mouth/Throat:     Mouth: Oropharynx is clear and moist and mucous membranes are normal.  Eyes:     Conjunctiva/sclera: Conjunctivae normal.     Pupils: Pupils are equal,  round, and reactive to light.  Neck:     Thyroid: No thyromegaly.  Cardiovascular:     Rate and Rhythm: Normal rate and regular rhythm.     Pulses: Intact distal pulses.     Heart sounds: Normal heart sounds. No murmur heard.   Pulmonary:     Effort: Pulmonary effort is normal. No respiratory distress.     Breath sounds: Normal breath sounds. No wheezing or rhonchi.  Abdominal:     General: Bowel sounds are normal. There is no distension.     Palpations: Abdomen is soft. There is no mass.     Tenderness: There is no abdominal tenderness.  Musculoskeletal:        General: No edema.     Cervical back: Neck supple.     Right lower leg: No edema.     Left lower leg: No edema.  Lymphadenopathy:     Cervical: No cervical adenopathy.  Skin:    General: Skin is warm and dry.  Neurological:     Mental Status: She is alert and oriented to person, place, and time.     Motor: No abnormal muscle tone.     Coordination: Coordination normal.  Psychiatric:        Mood and Affect: Mood and affect normal.        Behavior: Behavior normal.     A/P:   1. Annual physical exam - discussed importance of regular CV exercise, healthy diet, adequate sleep - UTD on PAP, mammo; cologuard normal in 10/2018; due for Dexa - UTD on vision and hearing - Lipid panel - CBC - Comprehensive metabolic panel - next CPE in 1 year   2. Primary hypertension - controlled, at goal Refill: -  hydrochlorothiazide (HYDRODIURIL) 25 MG tablet; Take 1 tablet (25 mg total) by mouth daily.  Dispense: 90 tablet; Refill: 3 - Comprehensive metabolic panel  3. Hypothyroidism, unspecified type Refill: - levothyroxine (SYNTHROID) 50 MCG tablet; Take 1 tablet (50 mcg total) by mouth daily.  Dispense: 90 tablet; Refill: 3 - TSH - T4, free  4. Vitamin D deficiency - taking 5,000IU daily most days - VITAMIN D 25 Hydroxy (Vit-D Deficiency, Fractures)  5. Hyperlipidemia, mixed - not on medication - Lipid panel  6. Other iron deficiency anemia - takes iron 325mg  daily most days - CBC - Iron, TIBC and Ferritin Panel  7. Screening for osteoporosis - DG Bone Density; Future   This visit occurred during the SARS-CoV-2 public health emergency.  Safety protocols were in place, including screening questions prior to the visit, additional usage of staff PPE, and extensive cleaning of exam room while observing appropriate contact time as indicated for disinfecting solutions.

## 2020-02-01 NOTE — Addendum Note (Signed)
Addended by: Konrad Saha on: 02/01/2020 01:51 PM   Modules accepted: Orders

## 2020-02-02 LAB — IRON,TIBC AND FERRITIN PANEL
%SAT: 30 % (calc) (ref 16–45)
Ferritin: 86 ng/mL (ref 16–288)
Iron: 101 ug/dL (ref 45–160)
TIBC: 337 mcg/dL (calc) (ref 250–450)

## 2020-02-06 ENCOUNTER — Telehealth: Payer: Self-pay | Admitting: Family Medicine

## 2020-02-06 DIAGNOSIS — E876 Hypokalemia: Secondary | ICD-10-CM

## 2020-02-06 NOTE — Telephone Encounter (Signed)
Patient is calling to get a copy of her labs printed and placed at the front desk for pick up once they have been reviewed by her provider. Please call her at [phone] when they are ready for pick up.

## 2020-02-07 NOTE — Telephone Encounter (Signed)
Will do after Provider reviews the labs.   Dm/cma

## 2020-02-09 MED ORDER — POTASSIUM CHLORIDE CRYS ER 20 MEQ PO TBCR: 20.0000 meq | EXTENDED_RELEASE_TABLET | Freq: Every day | ORAL | 0 refills | Status: DC

## 2020-02-09 NOTE — Telephone Encounter (Signed)
lft VM to rtn call. Dm/cma  

## 2020-02-09 NOTE — Telephone Encounter (Signed)
pts thyroid, iron, blood count, Vit D, liver function, and cholesterol look good. Her potassium is low at 3.1. normal is 3.5-4.5. I'm going to send an Rx for 3 tabs of potassium to her pharmacy. After that, I would recommend she increase her dietary potassium (banana per day) and I'd like to recheck in 3-4 wks.

## 2020-02-09 NOTE — Addendum Note (Signed)
Addended by: Ronnald Nian on: 02/09/2020 04:17 PM   Modules accepted: Orders

## 2020-02-09 NOTE — Telephone Encounter (Signed)
Patient asking for lab results and for them to printed so she can pick them up.   Please advise.  Thanks.  Dm/cma

## 2020-02-10 NOTE — Telephone Encounter (Signed)
Spoke to patient and gave her the results/recommedations.    She wants to know if she can get the pneumonia vaccine?  please advise.   Thanks. Dm/cma

## 2020-02-10 NOTE — Telephone Encounter (Signed)
Yes she can schedule RN visit for PCV 13 (prevnar) and then in 1 year she will get pneumovax

## 2020-02-10 NOTE — Telephone Encounter (Signed)
Patient is returning call to the office. Please call back.

## 2020-02-10 NOTE — Telephone Encounter (Signed)
Patient notified VIA and scheduled  For nurse visit.  Dm/cma

## 2020-02-13 ENCOUNTER — Other Ambulatory Visit: Payer: Self-pay

## 2020-02-14 ENCOUNTER — Ambulatory Visit (INDEPENDENT_AMBULATORY_CARE_PROVIDER_SITE_OTHER): Payer: Medicare HMO

## 2020-02-14 DIAGNOSIS — Z23 Encounter for immunization: Secondary | ICD-10-CM | POA: Diagnosis not present

## 2020-02-14 NOTE — Progress Notes (Signed)
Per orders of Dr. Bryan Lemma pt is here for prevnar 13immunization, was given in left deltoid IM, pt tolerated well.

## 2020-02-17 ENCOUNTER — Telehealth: Payer: Self-pay | Admitting: Family Medicine

## 2020-02-17 NOTE — Telephone Encounter (Signed)
Patient is calling to see if she can get orders for Bone Density sent to [facility]. If sent there, she can get it done next week instead of waiting until June. Please call her at [phone] if you have any questions.  Appointment: Tuesday- Feb 15th @ 1:00

## 2020-02-21 NOTE — Telephone Encounter (Signed)
Form is on providers desk to be signed then will be faxed.  Dm/cma

## 2020-02-21 NOTE — Telephone Encounter (Signed)
Pt called again, order not signed yet. Pt was advised to allow 48 hrs for processing.

## 2020-02-23 NOTE — Telephone Encounter (Signed)
Form signed and faxed to [facility]  [phone]. Patient notified [place] phone.  Dm/cma

## 2020-02-28 DIAGNOSIS — M85851 Other specified disorders of bone density and structure, right thigh: Secondary | ICD-10-CM | POA: Diagnosis not present

## 2020-02-28 DIAGNOSIS — Z780 Asymptomatic menopausal state: Secondary | ICD-10-CM | POA: Diagnosis not present

## 2020-02-28 LAB — HM DEXA SCAN

## 2020-03-02 ENCOUNTER — Other Ambulatory Visit: Payer: Self-pay

## 2020-03-02 ENCOUNTER — Encounter: Payer: Self-pay | Admitting: Family Medicine

## 2020-03-05 ENCOUNTER — Other Ambulatory Visit: Payer: Self-pay

## 2020-03-06 ENCOUNTER — Other Ambulatory Visit (INDEPENDENT_AMBULATORY_CARE_PROVIDER_SITE_OTHER): Payer: Medicare HMO

## 2020-03-06 DIAGNOSIS — E876 Hypokalemia: Secondary | ICD-10-CM

## 2020-03-06 LAB — BASIC METABOLIC PANEL
BUN: 13 mg/dL (ref 6–23)
CO2: 32 mEq/L (ref 19–32)
Calcium: 9.3 mg/dL (ref 8.4–10.5)
Chloride: 99 mEq/L (ref 96–112)
Creatinine, Ser: 1.22 mg/dL — ABNORMAL HIGH (ref 0.40–1.20)
GFR: 45.94 mL/min — ABNORMAL LOW (ref >60.00)
Glucose, Bld: 127 mg/dL — ABNORMAL HIGH (ref 70–99)
Potassium: 3.9 mEq/L (ref 3.5–5.1)
Sodium: 138 mEq/L (ref 135–145)

## 2020-03-06 NOTE — Progress Notes (Signed)
Per orders of Dr. Loletha Grayer pt is here for labs, pt tolerated labs well

## 2020-03-07 ENCOUNTER — Telehealth: Payer: Self-pay

## 2020-03-07 ENCOUNTER — Encounter: Payer: Self-pay | Admitting: Family Medicine

## 2020-03-07 NOTE — Telephone Encounter (Signed)
Please advise on message below. Thanks. Dm/cma  

## 2020-03-07 NOTE — Telephone Encounter (Signed)
Pt called about bone density results.  I informed pt that a nurse or Dr. Loletha Grayer will give her a call back as soon as they can to discuss results.  Please advise, [phone]

## 2020-03-07 NOTE — Telephone Encounter (Signed)
Done at [facility] and report is signed off on and awaiting scan into pts chart. Will contact pt once result is available/scanned into Epic

## 2020-03-09 NOTE — Telephone Encounter (Signed)
Patient notified and will wait to hear from provider. Dm/cma

## 2020-03-20 ENCOUNTER — Ambulatory Visit: Payer: Medicare HMO | Admitting: Gastroenterology

## 2020-03-20 ENCOUNTER — Encounter: Payer: Self-pay | Admitting: Gastroenterology

## 2020-03-20 VITALS — BP 118/80 | HR 84 | Ht 67.0 in | Wt 165.0 lb

## 2020-03-20 DIAGNOSIS — Z1211 Encounter for screening for malignant neoplasm of colon: Secondary | ICD-10-CM | POA: Diagnosis not present

## 2020-03-20 DIAGNOSIS — R140 Abdominal distension (gaseous): Secondary | ICD-10-CM

## 2020-03-20 DIAGNOSIS — R1084 Generalized abdominal pain: Secondary | ICD-10-CM

## 2020-03-20 MED ORDER — METAMUCIL 28.3 % PO POWD: ORAL | Status: AC

## 2020-03-20 NOTE — Patient Instructions (Addendum)
It was a pleasure to meet you today. Based on our discussion, I am providing you with my recommendations below:  RECOMMENDATION(S):   I am recommending a screening colonoscopy and an endoscopy to better evaluate your abdominal pain and bloating.  Please continue Metamucil daily  COLONOSCOPY AND ENDOSCOPY:   . You have been scheduled for an endoscopy and a colonoscopy. Please follow the written instructions given to you at your visit today.  PREP:   . Please pick up your prep supplies at the pharmacy within the next 1-3 days.  INHALERS:   . If you use inhalers (even only as needed), please bring them with you on the day of your procedure.  COLONOSCOPY TIPS:  . To reduce nausea and dehydration, stay well hydrated for 3-4 days prior to the exam.  . To prevent skin/hemorrhoid irritation - prior to wiping, put A&Dointment or vaseline on the toilet paper. Marland Kitchen Keep a towel or pad on the bed.  Marland Kitchen BEFORE STARTING YOUR PREP, drink  64oz of clear liquids in the morning. This will help to flush the colon and will ensure you are well hydrated!!!!  NOTE - This is in addition to the fluids required for to complete your prep. . Use of a flavored hard candy, such as grape Anise Salvo, can counteract some of the flavor of the prep and may prevent some nausea.   BMI:  . If you are age 68 or older, your body mass index should be between 23-30. Your There is no height or weight on file to calculate BMI. If this is out of the aforementioned range listed, please consider follow up with your Primary Care Provider.  Thank you for trusting me with your gastrointestinal care!    Thornton Park, MD, MPH

## 2020-03-20 NOTE — Progress Notes (Signed)
Referring Provider: Lavonia Drafts* Primary Care Physician:  Ronnald Nian, DO  Reason for Consultation:  Colon cancer screening and abdominal pain   IMPRESSION:  Abdominal pain and bloating not associated with bowel movements    - improves with GaxEx    - no alarm features Colon cancer screening    PLAN: Continue Metamucil EGD Colonoscopy  Please see the "Patient Instructions" section for addition details about the plan.  HPI: Lydia Joyce is a 70 y.o. female referred by Dr. Frazier Richards for colon cancer screening.  The history is obtained through the patient review of her electronic health record.  She is from [place] and has been in the states for over 50 years. Retired from the [employer].  Prior colon cancer screening: - Colonoscopy with Dr. Sammuel Cooper at [facility] 11/05/2007 was normal.  Repeat exam recommended in 10 years. - Cologuard negative 10/24/18  Best friend recently died rather abruptly from cancer. She is very motivated to be proactive for prevention and would like to have a colonoscopy.   Currently has some abdominal pain and bloating when she drinks canned milk. Improves with GasEx. No other identified food triggers.  No blood or mucous. No change in bowel habits. Concerned that she may have cancer. No change with eating, position, or defecation.  Has added a daily dose of Metamucil with slight improvement in her symptoms.   No known family history of colon cancer or polyps. No family history of uterine/endometrial cancer, pancreatic cancer or gastric/stomach cancer.  Labs 02/01/20: Normal CMP except for K 3.1, CBC normal, iron 101, ferritin 86, percent sat 30  No prior abdominal imaging.   Past Medical History:  Diagnosis Date   Arthritis    Chicken pox    Hyperlipidemia, mixed    Hypertension    Hypothyroidism    Persistent cough for 3 weeks or longer 02/24/2018   Thyroid disease    Vitamin D deficiency     Past  Surgical History:  Procedure Laterality Date   BREAST LUMPECTOMY WITH RADIOACTIVE SEED LOCALIZATION Left 06/01/2019   Procedure: LEFT BREAST LUMPECTOMY WITH RADIOACTIVE SEED LOCALIZATION;  Surgeon: Jovita Kussmaul, MD;  Location: [place];  Service: General;  Laterality: Left;   HERNIA REPAIR     umbilical    Current Outpatient Medications  Medication Sig Dispense Refill   Ascorbic Acid (VITAMIN C) 1000 MG tablet Take 1,000 mg by mouth daily.     aspirin EC 81 MG tablet Take 81 mg by mouth 2 (two) times a week.      Cholecalciferol (VITAMIN D3) 5000 UNITS CAPS Take by mouth.     diclofenac Sodium (VOLTAREN) 1 % GEL Apply 4 g topically 4 (four) times daily. 100 g 1   Ferrous Sulfate (IRON) 325 (65 FE) MG TABS Take 1 tablet by mouth daily as needed.      hydrochlorothiazide (HYDRODIURIL) 25 MG tablet Take 1 tablet (25 mg total) by mouth daily. 90 tablet 3   Misc Natural Products (GLUCOSAMINE CHOND COMPLEX/MSM) TABS Take 1 tablet by mouth daily.     Multiple Vitamin (MULTIVITAMIN) tablet Take 1 tablet by mouth daily.     potassium chloride SA (KLOR-CON) 20 MEQ tablet Take 1 tablet (20 mEq total) by mouth daily. 4 tablet 0   Psyllium (METAMUCIL) 28.3 % POWD Take qd     Zinc Acetate, Oral, (ZINC ACETATE PO) Take 5 mg by mouth daily.     levothyroxine (SYNTHROID) 50 MCG tablet Take 1  tablet (50 mcg total) by mouth daily. 90 tablet 3   No current facility-administered medications for this visit.    Allergies as of 03/20/2020 - Review Complete 03/20/2020  Allergen Reaction Noted   Lisinopril Swelling 09/10/2012    Family History  Problem Relation Age of Onset   Arthritis Mother    Stroke Mother    Arthritis Father    Stroke Father    Hypertension Father    Thyroid disease Sister    Colon cancer Neg Hx    Pancreatic cancer Neg Hx    Esophageal cancer Neg Hx    Liver cancer Neg Hx     Social History   Socioeconomic History   Marital  status: Divorced    Spouse name: Not on file   Number of children: Not on file   Years of education: Not on file   Highest education level: Not on file  Occupational History   Occupation: retired  Tobacco Use   Smoking status: Never Smoker   Smokeless tobacco: Never Used  Scientific laboratory technician Use: Never used  Substance and Sexual Activity   Alcohol use: No   Drug use: No   Sexual activity: Not Currently    Partners: Male  Other Topics Concern   Not on file  Social History Narrative   Exercise-- walk, treadmill 2x a week   Social Determinants of Health   Financial Resource Strain: Low Risk    Difficulty of Paying Living Expenses: Not hard at all  Food Insecurity: No Food Insecurity   Worried About Charity fundraiser in the Last Year: Never true   [place] in the Last Year: Never true  Transportation Needs: No Transportation Needs   Lack of Transportation (Medical): No   Lack of Transportation (Non-Medical): No  Physical Activity: Sufficiently Active   Days of Exercise per Week: 5 days   Minutes of Exercise per Session: 30 min  Stress: No Stress Concern Present   Feeling of Stress : Not at all  Social Connections: Moderately Integrated   Frequency of Communication with Friends and Family: More than three times a week   Frequency of Social Gatherings with Friends and Family: More than three times a week   Attends Religious Services: More than 4 times per year   Active Member of [employer] or Organizations: Yes   Attends Music therapist: More than 4 times per year   Marital Status: Divorced  Human resources officer Violence: Not At Risk   Fear of Current or Ex-Partner: No   Emotionally Abused: No   Physically Abused: No   Sexually Abused: No    Review of Systems: 12 system ROS is negative except as noted above.   Physical Exam: General:   Alert,  well-nourished, pleasant and cooperative in NAD Head:  Normocephalic and  atraumatic. Eyes:  Sclera clear, no icterus.   Conjunctiva pink. Ears:  Normal auditory acuity. Nose:  No deformity, discharge,  or lesions. Mouth:  No deformity or lesions.   Neck:  Supple; no masses or thyromegaly. Lungs:  Clear throughout to auscultation.   No wheezes. Heart:  Regular rate and rhythm; no murmurs. Abdomen:  Soft,nontender, nondistended, normal bowel sounds, no rebound or guarding. No hepatosplenomegaly.   Rectal:  Deferred  Msk:  Symmetrical. No boney deformities LAD: No inguinal or umbilical LAD Extremities:  No clubbing or edema. Neurologic:  Alert and  oriented x4;  grossly nonfocal Skin:  Intact without significant lesions or rashes.  Psych:  Alert and cooperative. Normal mood and affect.    Deyana Wnuk L. Tarri Glenn, MD, MPH 03/30/2020, 8:25 PM

## 2020-03-21 ENCOUNTER — Telehealth: Payer: Self-pay | Admitting: Family Medicine

## 2020-03-21 NOTE — Telephone Encounter (Signed)
Pt called and said she was supposed to get call back about her potassium lab work and her Bone density she had done at [place]. Please call pt back at [phone] with results

## 2020-03-22 NOTE — Telephone Encounter (Signed)
There was a comment attached to her lab result in mychart on 03/06/20 that notes it was seen by the patient on 03/09/20 at 6:24am.  "Normal potassium level and kidney function at baseline"  I see the Dexa was done but do not see the report. It may be in pile of documents/results to be scanned in. Can you call [facility] to have this faxed to [place] again?

## 2020-03-22 NOTE — Telephone Encounter (Signed)
Please see message and advise.  Thank you. Has this came back yet?

## 2020-03-23 ENCOUNTER — Encounter: Payer: Self-pay | Admitting: Family Medicine

## 2020-03-23 NOTE — Telephone Encounter (Signed)
Pt aware and just asked for call back when you receive it

## 2020-03-23 NOTE — Telephone Encounter (Signed)
Left message on voicemail to call office and inform her of message below.

## 2020-03-23 NOTE — Telephone Encounter (Signed)
I spoke to record department today and they are faxing over pt's Dexa scan results and her mammogram results as well.

## 2020-03-23 NOTE — Telephone Encounter (Signed)
I spoke with pt and informed her my message from Dr. Loletha Grayer.

## 2020-03-29 ENCOUNTER — Encounter: Payer: Self-pay | Admitting: Gastroenterology

## 2020-03-29 ENCOUNTER — Encounter: Payer: Self-pay | Admitting: *Deleted

## 2020-04-02 ENCOUNTER — Encounter: Payer: Medicare HMO | Admitting: Gastroenterology

## 2020-04-03 NOTE — Telephone Encounter (Signed)
I believe it is in my basket on my desk. I can review tomorrow when I'm in the office and we can return call to pt at that time

## 2020-04-03 NOTE — Telephone Encounter (Signed)
Pt would like a call back when bone density scan is resulted

## 2020-04-03 NOTE — Telephone Encounter (Signed)
I still do not see where they have sent anything here yet.  I will call [facility] back today.

## 2020-04-04 ENCOUNTER — Encounter: Payer: Medicare HMO | Admitting: Gastroenterology

## 2020-04-13 ENCOUNTER — Telehealth: Payer: Self-pay | Admitting: Family Medicine

## 2020-04-13 NOTE — Telephone Encounter (Signed)
Spoke with pt and informed her bone density results.

## 2020-04-13 NOTE — Telephone Encounter (Signed)
Reviewed pts dexa result. Bone density has improved compared to 2011. Her score is -1.3 which is in the osteopenia range. Cont with calcium and vit d supplements

## 2020-04-13 NOTE — Telephone Encounter (Signed)
Pt is wanting a cb concerning her bone density test and for someone to explain the shots she recently has gotten from our office. Please advise pt at [phone].

## 2020-04-13 NOTE — Telephone Encounter (Signed)
I spoke with pt and informed her the results have been faxed over and will call her back when Dr. Loletha Grayer takes a look at them.

## 2020-04-13 NOTE — Telephone Encounter (Signed)
Is there anyway that you could contact [facility] for this pt's bone density results?  I called them a couple of weeks ago in regards to them faxing [place] a copy and was told that they would fax it over the very same day.  As Dr. Loletha Grayer went through her paperwork she hasn't seen the rec of results and neither have I.  Let me know if you can help with this.  Thank you.

## 2020-04-13 NOTE — Telephone Encounter (Signed)
I called [facility] and they are supposed to fax over now. 4/1 1:48p KO

## 2020-05-04 ENCOUNTER — Other Ambulatory Visit: Payer: Self-pay

## 2020-05-04 ENCOUNTER — Ambulatory Visit (AMBULATORY_SURGERY_CENTER): Payer: Medicare HMO

## 2020-05-04 VITALS — Ht 67.0 in | Wt 167.0 lb

## 2020-05-04 DIAGNOSIS — Z1211 Encounter for screening for malignant neoplasm of colon: Secondary | ICD-10-CM

## 2020-05-04 DIAGNOSIS — R140 Abdominal distension (gaseous): Secondary | ICD-10-CM

## 2020-05-04 DIAGNOSIS — R1084 Generalized abdominal pain: Secondary | ICD-10-CM

## 2020-05-04 NOTE — Progress Notes (Signed)
VIRTUAL PV  No egg or soy allergy known to patient  No issues with past sedation with any surgeries or procedures Patient denies ever being told they had issues or difficulty with intubation  No FH of Malignant Hyperthermia No diet pills per patient No home 02 use per patient  No blood thinners per patient  Pt denies issues with constipation  No A fib or A flutter  EMMI video to pt or via [place] 19 guidelines implemented in PV today with Pt and RN  Pt is fully vaccinated  for Covid   Due to the COVID-19 pandemic we are asking patients to follow certain guidelines.  Pt aware of COVID protocols and [facility] guidelines

## 2020-05-17 ENCOUNTER — Encounter: Payer: Self-pay | Admitting: Gastroenterology

## 2020-05-18 ENCOUNTER — Other Ambulatory Visit: Payer: Self-pay | Admitting: Gastroenterology

## 2020-05-18 ENCOUNTER — Other Ambulatory Visit: Payer: Self-pay

## 2020-05-18 ENCOUNTER — Ambulatory Visit (AMBULATORY_SURGERY_CENTER): Payer: Medicare HMO | Admitting: Gastroenterology

## 2020-05-18 ENCOUNTER — Telehealth: Payer: Self-pay | Admitting: *Deleted

## 2020-05-18 ENCOUNTER — Encounter: Payer: Self-pay | Admitting: Gastroenterology

## 2020-05-18 VITALS — BP 128/67 | HR 50 | Temp 96.8°F | Resp 12 | Ht 67.0 in | Wt 167.0 lb

## 2020-05-18 DIAGNOSIS — K2100 Gastro-esophageal reflux disease with esophagitis, without bleeding: Secondary | ICD-10-CM | POA: Diagnosis not present

## 2020-05-18 DIAGNOSIS — D128 Benign neoplasm of rectum: Secondary | ICD-10-CM | POA: Diagnosis not present

## 2020-05-18 DIAGNOSIS — K2970 Gastritis, unspecified, without bleeding: Secondary | ICD-10-CM

## 2020-05-18 DIAGNOSIS — R140 Abdominal distension (gaseous): Secondary | ICD-10-CM

## 2020-05-18 DIAGNOSIS — D120 Benign neoplasm of cecum: Secondary | ICD-10-CM

## 2020-05-18 DIAGNOSIS — K2980 Duodenitis without bleeding: Secondary | ICD-10-CM

## 2020-05-18 DIAGNOSIS — R1084 Generalized abdominal pain: Secondary | ICD-10-CM | POA: Diagnosis not present

## 2020-05-18 DIAGNOSIS — Z1211 Encounter for screening for malignant neoplasm of colon: Secondary | ICD-10-CM

## 2020-05-18 DIAGNOSIS — I10 Essential (primary) hypertension: Secondary | ICD-10-CM | POA: Diagnosis not present

## 2020-05-18 DIAGNOSIS — K2950 Unspecified chronic gastritis without bleeding: Secondary | ICD-10-CM | POA: Diagnosis not present

## 2020-05-18 DIAGNOSIS — K449 Diaphragmatic hernia without obstruction or gangrene: Secondary | ICD-10-CM | POA: Diagnosis not present

## 2020-05-18 MED ORDER — PANTOPRAZOLE SODIUM 40 MG PO TBEC: 40.0000 mg | DELAYED_RELEASE_TABLET | Freq: Two times a day (BID) | ORAL | 3 refills | Status: DC

## 2020-05-18 MED ORDER — SODIUM CHLORIDE 0.9 % IV SOLN: 500.0000 mL | Freq: Once | INTRAVENOUS | Status: DC

## 2020-05-18 NOTE — Progress Notes (Signed)
Called to room to assist during endoscopic procedure.  Patient ID and intended procedure confirmed with present staff. Received instructions for my participation in the procedure from the performing physician.  

## 2020-05-18 NOTE — Op Note (Signed)
[place] Patient Name: Lydia Joyce Procedure Date: 05/18/2020 2:35 PM MRN: [id number] Endoscopist: Thornton Park MD, MD Age: 70 Referring MD:  Date of Birth: Feb 21, 1950 Gender: Female Account #: [id number] Procedure:                Upper GI endoscopy Indications:              Abdominal pain, Abdominal bloating Medicines:                Monitored Anesthesia Care Procedure:                Pre-Anesthesia Assessment:                           - Prior to the procedure, a History and Physical                            was performed, and patient medications and                            allergies were reviewed. The patient's tolerance of                            previous anesthesia was also reviewed. The risks                            and benefits of the procedure and the sedation                            options and risks were discussed with the patient.                            All questions were answered, and informed consent                            was obtained. Prior Anticoagulants: The patient has                            taken no previous anticoagulant or antiplatelet                            agents. ASA Grade Assessment: II - A patient with                            mild systemic disease. After reviewing the risks                            and benefits, the patient was deemed in                            satisfactory condition to undergo the procedure.                           After obtaining informed consent, the endoscope was  passed under direct vision. Throughout the                            procedure, the patient's blood pressure, pulse, and                            oxygen saturations were monitored continuously. The                            Endoscope was introduced through the mouth, and                            advanced to the second part of duodenum. The upper                            GI endoscopy was  accomplished without difficulty.                            The patient tolerated the procedure well. Scope In: Scope Out: Findings:                 LA Grade A (one or more mucosal breaks less than 5                            mm, not extending between tops of 2 mucosal folds)                            esophagitis with no bleeding was found 37 cm from                            the incisors. Biopsies were taken with a cold                            forceps for histology. Estimated blood loss was                            minimal.                           Patchy minimal inflammation characterized by                            erythema and granularity was found in the gastric                            body. Biopsies were taken with a cold forceps for                            histology. Estimated blood loss was minimal.                           Small hiatal hernia. The examined duodenum was  normal. Biopsies were taken with a cold forceps for                            histology. Estimated blood loss was minimal. Complications:            No immediate complications. Estimated blood loss:                            Minimal. Estimated Blood Loss:     Estimated blood loss was minimal. Impression:               - LA Grade A reflux esophagitis with no bleeding.                            Biopsied.                           - Gastritis. Biopsied.                           - Small hiatal hernia.                           - Normal examined duodenum. Biopsied. Recommendation:           - Patient has a contact number available for                            emergencies. The signs and symptoms of potential                            delayed complications were discussed with the                            patient. Return to normal activities tomorrow.                            Written discharge instructions were provided to the                            patient.                            - Resume previous diet.                           - Continue present medications.                           - Start pantoprazole 40 mg BID x 10 weeks.                           - No aspirin, ibuprofen, naproxen, or other                            non-steroidal anti-inflammatory drugs.                           -  Await pathology results.                           - Proceed with colonoscopy as previously planned. Thornton Park MD, MD 05/18/2020 3:06:56 PM This report has been signed electronically.

## 2020-05-18 NOTE — Progress Notes (Signed)
[place] vital signs. 

## 2020-05-18 NOTE — Telephone Encounter (Signed)
Called to see if patient could come in earlier as Dr. Payton Emerald had a cancellation.  Patient unable to come in any earlier due to her care partner.

## 2020-05-18 NOTE — Op Note (Signed)
[place] Patient Name: Lydia Joyce Procedure Date: 05/18/2020 2:34 PM MRN: [id number] Endoscopist: Thornton Park MD, MD Age: 70 Referring MD:  Date of Birth: 1950/10/05 Gender: Female Account #: [id number] Procedure:                Colonoscopy Indications:              Abdominal pain                           - Colonoscopy with Dr. Sammuel Cooper at [facility]                            11/05/2007 was normal. Repeat exam recommended in                            10 years.                           - Cologuard negative 10/18/2018 Medicines:                Monitored Anesthesia Care Procedure:                Pre-Anesthesia Assessment:                           - Prior to the procedure, a History and Physical                            was performed, and patient medications and                            allergies were reviewed. The patient's tolerance of                            previous anesthesia was also reviewed. The risks                            and benefits of the procedure and the sedation                            options and risks were discussed with the patient.                            All questions were answered, and informed consent                            was obtained. Prior Anticoagulants: The patient has                            taken no previous anticoagulant or antiplatelet                            agents. ASA Grade Assessment: II - A patient with  mild systemic disease. After reviewing the risks                            and benefits, the patient was deemed in                            satisfactory condition to undergo the procedure.                           After obtaining informed consent, the colonoscope                            was passed under direct vision. Throughout the                            procedure, the patient's blood pressure, pulse, and                            oxygen saturations were monitored  continuously. The                            Olympus CF-[id number] (#[id number]) Colonoscope was                            introduced through the anus and advanced to the 3                            cm into the ileum. A second forward view of the                            right colon was performed. The colonoscopy was                            performed without difficulty. The patient tolerated                            the procedure well. The quality of the bowel                            preparation was good. The terminal ileum, ileocecal                            valve, appendiceal orifice, and rectum were                            photographed. Scope In: 2:47:55 PM Scope Out: 3:01:50 PM Scope Withdrawal Time: 0 hours 11 minutes 7 seconds  Total Procedure Duration: 0 hours 13 minutes 55 seconds  Findings:                 The perianal and digital rectal examinations were                            normal.  A less than 1 mm polyp was found in the rectum. The                            polyp was flat. The polyp was removed with a cold                            biopsy forceps. Resection and retrieval were                            complete. Estimated blood loss was minimal.                           Five sessile polyps were found in the cecum. The                            polyps were <1 mm to 45mm in size. These polyps were                            removed with a cold snare except for a very small,                            flat polyp that could only be removed with forceps.                            Resection and retrieval were complete. Estimated                            blood loss was minimal.                           The exam was otherwise without abnormality on                            direct and retroflexion views. Complications:            No immediate complications. Estimated blood loss:                            Minimal. Estimated Blood  Loss:     Estimated blood loss was minimal. Impression:               - One less than 1 mm polyp in the rectum, removed                            with a cold biopsy forceps. Resected and retrieved.                           - Five 1 mm polyps in the cecum, removed with a                            cold snare. Resected and retrieved.                           -  The examination was otherwise normal on direct                            and retroflexion views. Recommendation:           - Patient has a contact number available for                            emergencies. The signs and symptoms of potential                            delayed complications were discussed with the                            patient. Return to normal activities tomorrow.                            Written discharge instructions were provided to the                            patient.                           - Resume previous diet.                           - Continue present medications.                           - Await pathology results.                           - Repeat colonoscopy date to be determined after                            pending pathology results are reviewed for                            surveillance.                           - Emerging evidence supports eating a diet of                            fruits, vegetables, grains, calcium, and yogurt                            while reducing red meat and alcohol may reduce the                            risk of colon cancer.                           - Thank you for allowing me to be involved in your  colon cancer prevention. Tressia Danas MD, MD 05/18/2020 3:10:42 PM This report has been signed electronically.

## 2020-05-18 NOTE — Progress Notes (Signed)
Pt's states no medical or surgical changes since previsit or office visit. 

## 2020-05-18 NOTE — Progress Notes (Signed)
IV removed and site was clean, dry and intact. Placed a gauze dressing that remained in place for about 10 minutes. Pt requested dressing to be removed at the time of getting dressed. RN removed gauze and site still clean, dry and intact. After the pt dressed herself, RN noticed that pt had developed swelling at IV site . The swelling was circular and about 1 inch wide and 1/4 in tall. Pt stated she had some slight discomfort at IV site. RN gave a heat pack for discomfort and swelling. Site otherwise looked intact.

## 2020-05-18 NOTE — Patient Instructions (Signed)
Handouts provided on polyps, hiatal hernia, esophagitis, and gastritis.   Start Pantoprazole 40mg  twice daily for 10 weeks.  No aspirin, ibuprofen, naproxen, or other non-steriodal anti-inflammatory drugs (NSAIDs). These medications can worsen the inflammation in your stomach.     YOU HAD AN ENDOSCOPIC PROCEDURE TODAY AT [facility]:   Refer to the procedure report that was given to you for any specific questions about what was found during the examination.  If the procedure report does not answer your questions, please call your gastroenterologist to clarify.  If you requested that your care partner not be given the details of your procedure findings, then the procedure report has been included in a sealed envelope for you to review at your convenience later.  YOU SHOULD EXPECT: Some feelings of bloating in the abdomen. Passage of more gas than usual.  Walking can help get rid of the air that was put into your GI tract during the procedure and reduce the bloating. If you had a lower endoscopy (such as a colonoscopy or flexible sigmoidoscopy) you may notice spotting of blood in your stool or on the toilet paper. If you underwent a bowel prep for your procedure, you may not have a normal bowel movement for a few days.  Please Note:  You might notice some irritation and congestion in your nose or some drainage.  This is from the oxygen used during your procedure.  There is no need for concern and it should clear up in a day or so.  SYMPTOMS TO REPORT IMMEDIATELY:   Following lower endoscopy (colonoscopy or flexible sigmoidoscopy):  Excessive amounts of blood in the stool  Significant tenderness or worsening of abdominal pains  Swelling of the abdomen that is new, acute  Fever of 100F or higher   Following upper endoscopy (EGD)  Vomiting of blood or coffee ground material  New chest pain or pain under the shoulder blades  Painful or persistently difficult swallowing  New  shortness of breath  Fever of 100F or higher  Black, tarry-looking stools  For urgent or emergent issues, a gastroenterologist can be reached at any hour by calling [phone]. Do not use MyChart messaging for urgent concerns.    DIET:  We do recommend a small meal at first, but then you may proceed to your regular diet.  Drink plenty of fluids but you should avoid alcoholic beverages for 24 hours.  ACTIVITY:  You should plan to take it easy for the rest of today and you should NOT DRIVE or use heavy machinery until tomorrow (because of the sedation medicines used during the test).    FOLLOW UP: Our staff will call the number listed on your records 48-72 hours following your procedure to check on you and address any questions or concerns that you may have regarding the information given to you following your procedure. If we do not reach you, we will leave a message.  We will attempt to reach you two times.  During this call, we will ask if you have developed any symptoms of COVID 19. If you develop any symptoms (ie: fever, flu-like symptoms, shortness of breath, cough etc.) before then, please call [phone].  If you test positive for Covid 19 in the 2 weeks post procedure, please call and report this information to [place].    If any biopsies were taken you will be contacted by phone or by letter within the next 1-3 weeks.  Please call us at [phone] if you  have not heard about the biopsies in 3 weeks.    SIGNATURES/CONFIDENTIALITY: You and/or your care partner have signed paperwork which will be entered into your electronic medical record.  These signatures attest to the fact that that the information above on your After Visit Summary has been reviewed and is understood.  Full responsibility of the confidentiality of this discharge information lies with you and/or your care-partner.

## 2020-05-18 NOTE — Progress Notes (Signed)
Report to PACU, RN, vss, BBS= Clear.  

## 2020-05-22 ENCOUNTER — Telehealth: Payer: Self-pay | Admitting: *Deleted

## 2020-05-22 NOTE — Telephone Encounter (Signed)
  Follow up Call-  Call back number 05/18/2020  Post procedure Call Back phone  # [phone]  Permission to leave phone message Yes  Some recent data might be hidden     Patient questions:  Do you have a fever, pain , or abdominal swelling? No. Pain Score  0 *  Have you tolerated food without any problems? Yes.    Have you been able to return to your normal activities? Yes.    Do you have any questions about your discharge instructions: Diet   No. Medications  No. Follow up visit  No.  Do you have questions or concerns about your Care? No.  Actions: * If pain score is 4 or above: No action needed, pain <4.  1. Have you developed a fever since your procedure? no  2.   Have you had an respiratory symptoms (SOB or cough) since your procedure? no  3.   Have you tested positive for COVID 19 since your procedure no  4.   Have you had any family members/close contacts diagnosed with the COVID 19 since your procedure?  no   If yes to any of these questions please route to Joylene John, RN and Joella Prince, RN

## 2020-05-30 ENCOUNTER — Encounter: Payer: Self-pay | Admitting: Gastroenterology

## 2020-07-11 ENCOUNTER — Telehealth: Payer: Self-pay | Admitting: Gastroenterology

## 2020-07-11 ENCOUNTER — Other Ambulatory Visit: Payer: Medicare HMO

## 2020-07-11 NOTE — Telephone Encounter (Signed)
Attempt to contact patient, no answer. LMTCB

## 2020-07-11 NOTE — Telephone Encounter (Signed)
Inbound call from patient requesting a call from a nurse please.  Has questions about when she should do her follow-up after procedure.

## 2020-07-12 NOTE — Telephone Encounter (Signed)
Spoke with patient. Patient would like to speak with MD. Patient inquiring about the "next steps" following procedure (Colon/Endo) done in May. Pt states she is unsure when to follow up, if she needs to follow, what does it mean of what was found during procedure, if she is to have another procedure. States she is just confused. Please advise, thank you

## 2020-07-12 NOTE — Telephone Encounter (Signed)
Inbound call from patient returning call . Contact # [phone]

## 2020-07-13 NOTE — Telephone Encounter (Signed)
Spoke with patient. Patient states she has received letter and looked over. Patient states she is taking pantoprazole and it is improving symptoms. Patient scheduled for OV with Dr Tarri Glenn 08/24/20 at 10:30 am. Nothing further needed at this time.

## 2020-07-21 ENCOUNTER — Encounter (HOSPITAL_COMMUNITY): Payer: Self-pay

## 2020-08-24 ENCOUNTER — Encounter: Payer: Self-pay | Admitting: Gastroenterology

## 2020-08-24 ENCOUNTER — Ambulatory Visit: Payer: Medicare HMO | Admitting: Gastroenterology

## 2020-08-24 VITALS — BP 138/62 | HR 50 | Ht 67.0 in | Wt 167.5 lb

## 2020-08-24 DIAGNOSIS — K2990 Gastroduodenitis, unspecified, without bleeding: Secondary | ICD-10-CM

## 2020-08-24 DIAGNOSIS — K2970 Gastritis, unspecified, without bleeding: Secondary | ICD-10-CM

## 2020-08-24 DIAGNOSIS — K2100 Gastro-esophageal reflux disease with esophagitis, without bleeding: Secondary | ICD-10-CM

## 2020-08-24 NOTE — Patient Instructions (Signed)
If you are age 68 or older, your body mass index should be between 23-30. Your Body mass index is 26.23 kg/m. If this is out of the aforementioned range listed, please consider follow up with your Primary Care Provider.  If you are age 31 or younger, your body mass index should be between 19-25. Your Body mass index is 26.23 kg/m. If this is out of the aformentioned range listed, please consider follow up with your Primary Care Provider.   __________________________________________________________  The [facility] providers would like to encourage you to use [facility] to communicate with providers for non-urgent requests or questions.  Due to long hold times on the telephone, sending your provider a message by [facility] may be a faster and more efficient way to get a response.  Please allow 48 business hours for a response.  Please remember that this is for non-urgent requests.   Thank you for trusting me with your gastrointestinal care!    Thornton Park, MD, MPH

## 2020-08-24 NOTE — Progress Notes (Signed)
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, gastritis, and duodenitis    - reviewed procedure not and pathology results with the patient today    - avoid all NSAIDs    - symptoms improved with pantoprazole    - overall feeling better Abdominal pain and bloating not associated with bowel movements    - resolved with Metamucil and treatment with PPI therapy    - will continue with daily Metamucil History of colon polyps    - 6 tubular adenomas on colonoscopy 05/18/20    - reviewed procedure note and pathology results with the patient today    - surveillance colonoscopy recommended in 2025    PLAN: - Continue Metamucil daily - Continue pantorpazole 40 mg daily - taper as able - Colonoscopy 2025, earlier with new symptoms - Return at least annually or earlier as needed  Please see the "Patient Instructions" section for addition details about the plan.  HPI: Lydia Joyce is a 70 y.o. female who returns in follow-up after endoscopic evaluation. Her abdominal pain and bloating have essentially resolved with a daily dose of Metamucil. She has stopped using raw oatmeal, raw spinach and raw beets in her near daily smoothie and thinks this is helping, as well. No new complaints or concerns.    Endoscopic history: - Colonoscopy with Dr. Sammuel Cooper at [facility] 11/05/2007: normal.   - Cologuard negative 10/18/2018 - Colonoscopy 05/18/20: 6 tubular adenomas - EGD 05/18/20: LA Grade reflux, small hiatal hernia, reflux esophagitis, peptic duodenitis  No known family history of colon cancer or polyps. No family history of uterine/endometrial cancer, pancreatic cancer or gastric/stomach cancer.    Past Medical History:  Diagnosis Date   Arthritis    Chicken pox    Hypertension    Hypothyroidism     Persistent cough for 3 weeks or longer 02/24/2018   Thyroid disease    Vitamin D deficiency     Past Surgical History:  Procedure Laterality Date   BREAST LUMPECTOMY WITH RADIOACTIVE SEED LOCALIZATION Left 06/01/2019   Procedure: LEFT BREAST LUMPECTOMY WITH RADIOACTIVE SEED LOCALIZATION;  Surgeon: [name], MD;  Location: [place];  Service: General;  Laterality: Left;   COLONOSCOPY  2009   HERNIA REPAIR     umbilical    Current Outpatient Medications  Medication Sig Dispense Refill   Ascorbic Acid (VITAMIN C) 1000 MG tablet Take 1,000 mg by mouth daily.     aspirin EC 81 MG tablet Take 81 mg by mouth 2 (two) times a week.      Cholecalciferol (VITAMIN D3) 5000 UNITS CAPS Take by mouth.     Ferrous Sulfate (IRON) 325 (65 FE) MG TABS Take 1 tablet by mouth daily as needed.      hydrochlorothiazide (HYDRODIURIL) 25 MG tablet Take 1 tablet (25 mg total) by mouth daily. 90 tablet 3   levothyroxine (SYNTHROID) 50 MCG tablet Take 1 tablet (50 mcg total) by mouth daily. 90 tablet 3   Misc Natural Products (GLUCOSAMINE CHOND COMPLEX/MSM) TABS Take 1 tablet by mouth daily.     Multiple Vitamin (MULTIVITAMIN) tablet Take 1 tablet by mouth daily.     Psyllium (METAMUCIL) 28.3 % POWD Take qd     No current facility-administered medications for this visit.    Allergies as of 08/24/2020 -  Review Complete 08/24/2020  Allergen Reaction Noted   Lisinopril Swelling 09/10/2012    Family History  Problem Relation Age of Onset   Arthritis Mother    Stroke Mother    Arthritis Father    Stroke Father    Hypertension Father    Thyroid disease Sister    Colon cancer Neg Hx    Pancreatic cancer Neg Hx    Esophageal cancer Neg Hx    Liver cancer Neg Hx    Rectal cancer Neg Hx    Stomach cancer Neg Hx       Physical Exam: General:   Alert,  well-nourished, pleasant and cooperative in NAD Head:  Normocephalic and atraumatic. Eyes:  Sclera clear, no icterus.    Conjunctiva pink. Ears:  Normal auditory acuity. Nose:  No deformity, discharge,  or lesions. Mouth:  No deformity or lesions.   Neck:  Supple; no masses or thyromegaly. Lungs:  Clear throughout to auscultation.   No wheezes. Heart:  Regular rate and rhythm; no murmurs. Abdomen:  Soft,nontender, nondistended, normal bowel sounds, no rebound or guarding. No hepatosplenomegaly.   Rectal:  Deferred  Msk:  Symmetrical. No boney deformities LAD: No inguinal or umbilical LAD Extremities:  No clubbing or edema. Neurologic:  Alert and  oriented x4;  grossly nonfocal Skin:  Intact without significant lesions or rashes. Psych:  Alert and cooperative. Normal mood and affect.    Areyana Leoni L. Tarri Glenn, MD, MPH 08/24/2020, 10:48 AM

## 2020-09-27 DIAGNOSIS — Z23 Encounter for immunization: Secondary | ICD-10-CM | POA: Diagnosis not present

## 2020-11-02 DIAGNOSIS — Z1231 Encounter for screening mammogram for malignant neoplasm of breast: Secondary | ICD-10-CM | POA: Diagnosis not present

## 2020-11-02 LAB — HM MAMMOGRAPHY

## 2020-11-10 DIAGNOSIS — Z7982 Long term (current) use of aspirin: Secondary | ICD-10-CM | POA: Diagnosis not present

## 2020-11-10 DIAGNOSIS — M1990 Unspecified osteoarthritis, unspecified site: Secondary | ICD-10-CM | POA: Diagnosis not present

## 2020-11-10 DIAGNOSIS — Z888 Allergy status to other drugs, medicaments and biological substances status: Secondary | ICD-10-CM | POA: Diagnosis not present

## 2020-11-10 DIAGNOSIS — E039 Hypothyroidism, unspecified: Secondary | ICD-10-CM | POA: Diagnosis not present

## 2020-11-10 DIAGNOSIS — I10 Essential (primary) hypertension: Secondary | ICD-10-CM | POA: Diagnosis not present

## 2020-11-28 DIAGNOSIS — H5203 Hypermetropia, bilateral: Secondary | ICD-10-CM | POA: Diagnosis not present

## 2020-11-30 ENCOUNTER — Encounter: Payer: Self-pay | Admitting: Family Medicine

## 2021-01-04 DIAGNOSIS — H524 Presbyopia: Secondary | ICD-10-CM | POA: Diagnosis not present

## 2021-01-04 DIAGNOSIS — H52209 Unspecified astigmatism, unspecified eye: Secondary | ICD-10-CM | POA: Diagnosis not present

## 2021-01-04 DIAGNOSIS — H5203 Hypermetropia, bilateral: Secondary | ICD-10-CM | POA: Diagnosis not present

## 2021-01-17 ENCOUNTER — Encounter: Payer: Self-pay | Admitting: Family Medicine

## 2021-01-17 ENCOUNTER — Ambulatory Visit (INDEPENDENT_AMBULATORY_CARE_PROVIDER_SITE_OTHER): Payer: Medicare HMO | Admitting: Family Medicine

## 2021-01-17 ENCOUNTER — Other Ambulatory Visit: Payer: Self-pay

## 2021-01-17 VITALS — BP 136/68 | HR 54 | Temp 97.1°F | Ht 67.0 in | Wt 172.6 lb

## 2021-01-17 DIAGNOSIS — D508 Other iron deficiency anemias: Secondary | ICD-10-CM

## 2021-01-17 DIAGNOSIS — D649 Anemia, unspecified: Secondary | ICD-10-CM | POA: Insufficient documentation

## 2021-01-17 DIAGNOSIS — E559 Vitamin D deficiency, unspecified: Secondary | ICD-10-CM

## 2021-01-17 DIAGNOSIS — Z23 Encounter for immunization: Secondary | ICD-10-CM | POA: Diagnosis not present

## 2021-01-17 DIAGNOSIS — E039 Hypothyroidism, unspecified: Secondary | ICD-10-CM | POA: Diagnosis not present

## 2021-01-17 DIAGNOSIS — E782 Mixed hyperlipidemia: Secondary | ICD-10-CM

## 2021-01-17 DIAGNOSIS — I10 Essential (primary) hypertension: Secondary | ICD-10-CM

## 2021-01-17 NOTE — Progress Notes (Signed)
Established Patient Office Visit  Subjective:  Patient ID: Lydia Joyce, female    DOB: 02/01/1950  Age: 71 y.o. MRN: [id number]  CC:  Chief Complaint  Patient presents with   Transitions Of Care    TOC from Dr. Bryan Lemma, no concerns.     HPI Lydia Joyce presents for establishment of care by transfer.  History of hypertension, hypothyroidism, vitamin D deficiency, intermittent iron deficiency and anemia.  She is retired.  She is active physically by [facility] and walking and swimming.  She does not smoke drink alcohol or use illicit drugs.  She is from [place] and travels to the country frequently.  Her son and his family are currently living with her and looking for a house.  She sees GYN for well woman care.  She is due for Tdap today.  History of hypokalemia.  She has increased the potassium in her diet.  Past Medical History:  Diagnosis Date   Arthritis    Chicken pox    Hypertension    Hypothyroidism    Persistent cough for 3 weeks or longer 02/24/2018   Thyroid disease    Vitamin D deficiency     Past Surgical History:  Procedure Laterality Date   BREAST LUMPECTOMY WITH RADIOACTIVE SEED LOCALIZATION Left 06/01/2019   Procedure: LEFT BREAST LUMPECTOMY WITH RADIOACTIVE SEED LOCALIZATION;  Surgeon: Jovita Kussmaul, MD;  Location: [facility];  Service: General;  Laterality: Left;   COLONOSCOPY  2009   HERNIA REPAIR     umbilical    Family History  Problem Relation Age of Onset   Arthritis Mother    Stroke Mother    Arthritis Father    Stroke Father    Hypertension Father    Thyroid disease Sister    Colon cancer Neg Hx    Pancreatic cancer Neg Hx    Esophageal cancer Neg Hx    Liver cancer Neg Hx    Rectal cancer Neg Hx    Stomach cancer Neg Hx     Social History   Socioeconomic History   Marital status: Divorced    Spouse name: Not on file   Number of children: Not on file   Years of education: Not on file   Highest education level:  Not on file  Occupational History   Occupation: retired  Tobacco Use   Smoking status: Never   Smokeless tobacco: Never  Vaping Use   Vaping Use: Never used  Substance and Sexual Activity   Alcohol use: No   Drug use: No   Sexual activity: Not Currently    Partners: Male  Other Topics Concern   Not on file  Social History Narrative   Exercise-- walk, treadmill 2x a week   Social Determinants of Health   Financial Resource Strain: Not on file  Food Insecurity: Not on file  Transportation Needs: Not on file  Physical Activity: Not on file  Stress: Not on file  Social Connections: Not on file  Intimate Partner Violence: Not on file    Outpatient Medications Prior to Visit  Medication Sig Dispense Refill   Ascorbic Acid (VITAMIN C) 1000 MG tablet Take 1,000 mg by mouth daily.     aspirin EC 81 MG tablet Take 81 mg by mouth 2 (two) times a week.      Cholecalciferol (VITAMIN D3) 5000 UNITS CAPS Take by mouth.     Ferrous Sulfate (IRON) 325 (65 FE) MG TABS Take 1 tablet  by mouth daily as needed.      hydrochlorothiazide (HYDRODIURIL) 25 MG tablet Take 1 tablet (25 mg total) by mouth daily. 90 tablet 3   levothyroxine (SYNTHROID) 50 MCG tablet Take 1 tablet (50 mcg total) by mouth daily. 90 tablet 3   Misc Natural Products (GLUCOSAMINE CHOND COMPLEX/MSM) TABS Take 1 tablet by mouth daily.     Multiple Vitamin (MULTIVITAMIN) tablet Take 1 tablet by mouth daily.     Psyllium (METAMUCIL) 28.3 % POWD Take qd     No facility-administered medications prior to visit.    Allergies  Allergen Reactions   Lisinopril Swelling    Lip swelling    ROS Review of Systems  Constitutional:  Negative for chills, diaphoresis, fatigue, fever and unexpected weight change.  HENT: Negative.    Eyes:  Negative for photophobia and visual disturbance.  Respiratory: Negative.    Cardiovascular: Negative.   Gastrointestinal: Negative.   Endocrine: Negative for polyphagia and polyuria.   Genitourinary: Negative.   Neurological:  Negative for speech difficulty and weakness.  Psychiatric/Behavioral: Negative.       Objective:    Physical Exam Vitals and nursing note reviewed.  Constitutional:      General: She is not in acute distress.    Appearance: Normal appearance. She is normal weight. She is not ill-appearing, toxic-appearing or diaphoretic.  HENT:     Head: Normocephalic and atraumatic.     Right Ear: Tympanic membrane, ear canal and external ear normal.     Left Ear: Tympanic membrane, ear canal and external ear normal.     Mouth/Throat:     Mouth: Mucous membranes are moist.     Pharynx: Oropharynx is clear. No oropharyngeal exudate or posterior oropharyngeal erythema.  Eyes:     General:        Right eye: No discharge.        Left eye: No discharge.     Extraocular Movements: Extraocular movements intact.     Conjunctiva/sclera: Conjunctivae normal.     Pupils: Pupils are equal, round, and reactive to light.  Neck:     Vascular: No carotid bruit.  Cardiovascular:     Rate and Rhythm: Normal rate and regular rhythm.  Pulmonary:     Effort: Pulmonary effort is normal.     Breath sounds: Normal breath sounds.  Musculoskeletal:     Cervical back: No rigidity or tenderness.  Skin:    General: Skin is warm and dry.  Neurological:     Mental Status: She is alert and oriented to person, place, and time.  Psychiatric:        Mood and Affect: Mood normal.        Behavior: Behavior normal.    BP 136/68 (BP Location: Right Arm, Patient Position: Sitting, Cuff Size: Normal)    Pulse (!) 54    Temp (!) 97.1 F (36.2 C) (Temporal)    Ht 5\' 7"  (1.702 m)    Wt 172 lb 9.6 oz (78.3 kg)    SpO2 100%    BMI 27.03 kg/m  Wt Readings from Last 3 Encounters:  01/17/21 172 lb 9.6 oz (78.3 kg)  08/24/20 167 lb 8 oz (76 kg)  05/18/20 167 lb (75.8 kg)     Health Maintenance Due  Topic Date Due   Zoster Vaccines- Shingrix (1 of 2) Never done   TETANUS/TDAP   06/02/2020   PAP SMEAR-Modifier  01/24/2021   Pneumonia Vaccine 71+ Years old (2 - PPSV23 if available, else  PCV20) 02/13/2021    There are no preventive care reminders to display for this patient.  Lab Results  Component Value Date   TSH 0.89 02/01/2020   Lab Results  Component Value Date   WBC 3.9 (L) 02/01/2020   HGB 12.8 02/01/2020   HCT 38.0 02/01/2020   MCV 86.4 02/01/2020   PLT 207.0 02/01/2020   Lab Results  Component Value Date   NA 138 03/06/2020   K 3.9 03/06/2020   CO2 32 03/06/2020   GLUCOSE 127 (H) 03/06/2020   BUN 13 03/06/2020   CREATININE 1.22 (H) 03/06/2020   BILITOT 0.9 02/01/2020   ALKPHOS 51 02/01/2020   AST 21 02/01/2020   ALT 17 02/01/2020   PROT 7.1 02/01/2020   ALBUMIN 4.6 02/01/2020   CALCIUM 9.3 03/06/2020   ANIONGAP 9 05/25/2019   GFR 45.94 (L) 03/06/2020   Lab Results  Component Value Date   CHOL 183 02/01/2020   Lab Results  Component Value Date   HDL 48.50 02/01/2020   Lab Results  Component Value Date   LDLCALC 120 (H) 02/01/2020   Lab Results  Component Value Date   TRIG 71.0 02/01/2020   Lab Results  Component Value Date   CHOLHDL 4 02/01/2020   Lab Results  Component Value Date   HGBA1C 5.7 10/03/2016      Assessment & Plan:   Problem List Items Addressed This Visit       Cardiovascular and Mediastinum   Primary hypertension     Endocrine   Hypothyroidism     Other   Vitamin D deficiency   Hyperlipidemia, mixed   Absolute anemia   Need for Tdap vaccination - Primary   Relevant Orders   Tdap vaccine greater than or equal to 7yo IM    No orders of the defined types were placed in this encounter.   Follow-up: Return Will return in a few weeks for scheduled OV for physical and fasting blood work..  Advised Shingrix vaccine.   Libby Maw, MD

## 2021-02-06 ENCOUNTER — Encounter: Payer: Self-pay | Admitting: Family Medicine

## 2021-02-06 ENCOUNTER — Telehealth: Payer: Self-pay | Admitting: Family Medicine

## 2021-02-06 ENCOUNTER — Other Ambulatory Visit: Payer: Self-pay

## 2021-02-06 ENCOUNTER — Ambulatory Visit (INDEPENDENT_AMBULATORY_CARE_PROVIDER_SITE_OTHER): Payer: Medicare HMO | Admitting: Family Medicine

## 2021-02-06 VITALS — BP 132/70 | HR 52 | Temp 97.0°F | Ht 67.0 in | Wt 173.8 lb

## 2021-02-06 DIAGNOSIS — E039 Hypothyroidism, unspecified: Secondary | ICD-10-CM

## 2021-02-06 DIAGNOSIS — I10 Essential (primary) hypertension: Secondary | ICD-10-CM | POA: Diagnosis not present

## 2021-02-06 DIAGNOSIS — E559 Vitamin D deficiency, unspecified: Secondary | ICD-10-CM

## 2021-02-06 DIAGNOSIS — Z0000 Encounter for general adult medical examination without abnormal findings: Secondary | ICD-10-CM | POA: Diagnosis not present

## 2021-02-06 DIAGNOSIS — E782 Mixed hyperlipidemia: Secondary | ICD-10-CM

## 2021-02-06 DIAGNOSIS — D508 Other iron deficiency anemias: Secondary | ICD-10-CM

## 2021-02-06 DIAGNOSIS — Z23 Encounter for immunization: Secondary | ICD-10-CM

## 2021-02-06 LAB — COMPREHENSIVE METABOLIC PANEL
ALT: 23 U/L (ref 0–35)
AST: 26 U/L (ref 0–37)
Albumin: 4.2 g/dL (ref 3.5–5.2)
Alkaline Phosphatase: 58 U/L (ref 39–117)
BUN: 15 mg/dL (ref 6–23)
CO2: 33 mEq/L — ABNORMAL HIGH (ref 19–32)
Calcium: 9.7 mg/dL (ref 8.4–10.5)
Chloride: 98 mEq/L (ref 96–112)
Creatinine, Ser: 1.23 mg/dL — ABNORMAL HIGH (ref 0.40–1.20)
GFR: 45.19 mL/min — ABNORMAL LOW (ref >60.00)
Glucose, Bld: 88 mg/dL (ref 70–99)
Potassium: 3.3 mEq/L — ABNORMAL LOW (ref 3.5–5.1)
Sodium: 138 mEq/L (ref 135–145)
Total Bilirubin: 0.6 mg/dL (ref 0.2–1.2)
Total Protein: 6.9 g/dL (ref 6.0–8.3)

## 2021-02-06 LAB — URINALYSIS, ROUTINE W REFLEX MICROSCOPIC
Bilirubin Urine: NEGATIVE
Hgb urine dipstick: NEGATIVE
Ketones, ur: NEGATIVE
Leukocytes,Ua: NEGATIVE
Nitrite: NEGATIVE
RBC / HPF: NONE SEEN (ref 0–?)
Specific Gravity, Urine: <=1.005 — AB (ref 1.000–1.030)
Total Protein, Urine: NEGATIVE
Urine Glucose: NEGATIVE
Urobilinogen, UA: 0.2 (ref 0.0–1.0)
WBC, UA: NONE SEEN (ref 0–?)
pH: 6.5 (ref 5.0–8.0)

## 2021-02-06 LAB — CBC
HCT: 37.5 % (ref 36.0–46.0)
Hemoglobin: 12.2 g/dL (ref 12.0–15.0)
MCHC: 32.6 g/dL (ref 30.0–36.0)
MCV: 86.9 fl (ref 78.0–100.0)
Platelets: 210 10*3/uL (ref 150.0–400.0)
RBC: 4.32 Mil/uL (ref 3.87–5.11)
RDW: 14.2 % (ref 11.5–15.5)
WBC: 3.4 10*3/uL — ABNORMAL LOW (ref 4.0–10.5)

## 2021-02-06 LAB — LIPID PANEL
Cholesterol: 154 mg/dL (ref 0–200)
HDL: 41 mg/dL (ref >39.00)
LDL Cholesterol: 102 mg/dL — ABNORMAL HIGH (ref 0–99)
NonHDL: 113.29
Total CHOL/HDL Ratio: 4
Triglycerides: 57 mg/dL (ref 0.0–149.0)
VLDL: 11.4 mg/dL (ref 0.0–40.0)

## 2021-02-06 LAB — LDL CHOLESTEROL, DIRECT: Direct LDL: 100 mg/dL

## 2021-02-06 LAB — VITAMIN D 25 HYDROXY (VIT D DEFICIENCY, FRACTURES): VITD: 27.73 ng/mL — ABNORMAL LOW (ref 30.00–100.00)

## 2021-02-06 LAB — TSH: TSH: 1.09 u[IU]/mL (ref 0.35–5.50)

## 2021-02-06 NOTE — Progress Notes (Signed)
Established Patient Office Visit  Subjective:  Patient ID: Lydia Joyce, female    DOB: 1950-01-15  Age: 71 y.o. MRN: [id number]  CC:  Chief Complaint  Patient presents with   Annual Exam    CPE, no concerns patient fasting for labs.     HPI Lydia Joyce presents for physical and fasting blood work.  She will be due for Prevnar 20 next month.  She has colonoscopies every 3 years secondary to polyps found.  Will be due in thyroidism.  For her next.  Blood pressure controlled with HCTZ and she maintains a high potassium diet.  History of iron deficiency.  Has been well controlled with daily iron.  Continues levothyroxine 50 mcg for hypothyroidism.  5000 units of vitamin D daily.  Past Medical History:  Diagnosis Date   Arthritis    Chicken pox    Hypertension    Hypothyroidism    Persistent cough for 3 weeks or longer 02/24/2018   Thyroid disease    Vitamin D deficiency     Past Surgical History:  Procedure Laterality Date   BREAST LUMPECTOMY WITH RADIOACTIVE SEED LOCALIZATION Left 06/01/2019   Procedure: LEFT BREAST LUMPECTOMY WITH RADIOACTIVE SEED LOCALIZATION;  Surgeon: Jovita Kussmaul, MD;  Location: [facility];  Service: General;  Laterality: Left;   COLONOSCOPY  2009   HERNIA REPAIR     umbilical    Family History  Problem Relation Age of Onset   Arthritis Mother    Stroke Mother    Arthritis Father    Stroke Father    Hypertension Father    Thyroid disease Sister    Colon cancer Neg Hx    Pancreatic cancer Neg Hx    Esophageal cancer Neg Hx    Liver cancer Neg Hx    Rectal cancer Neg Hx    Stomach cancer Neg Hx     Social History   Socioeconomic History   Marital status: Divorced    Spouse name: Not on file   Number of children: Not on file   Years of education: Not on file   Highest education level: Not on file  Occupational History   Occupation: retired  Tobacco Use   Smoking status: Never   Smokeless tobacco: Never  Vaping Use    Vaping Use: Never used  Substance and Sexual Activity   Alcohol use: No   Drug use: No   Sexual activity: Not Currently    Partners: Male  Other Topics Concern   Not on file  Social History Narrative   Exercise-- walk, treadmill 2x a week   Social Determinants of Health   Financial Resource Strain: Not on file  Food Insecurity: Not on file  Transportation Needs: Not on file  Physical Activity: Not on file  Stress: Not on file  Social Connections: Not on file  Intimate Partner Violence: Not on file    Outpatient Medications Prior to Visit  Medication Sig Dispense Refill   Ascorbic Acid (VITAMIN C) 1000 MG tablet Take 1,000 mg by mouth daily.     aspirin EC 81 MG tablet Take 81 mg by mouth 2 (two) times a week.      Cholecalciferol (VITAMIN D3) 5000 UNITS CAPS Take by mouth.     Ferrous Sulfate (IRON) 325 (65 FE) MG TABS Take 1 tablet by mouth daily as needed.      hydrochlorothiazide (HYDRODIURIL) 25 MG tablet Take 1 tablet (25 mg total) by mouth daily. 90 tablet  3   levothyroxine (SYNTHROID) 50 MCG tablet Take 1 tablet (50 mcg total) by mouth daily. 90 tablet 3   Misc Natural Products (GLUCOSAMINE CHOND COMPLEX/MSM) TABS Take 1 tablet by mouth daily.     Multiple Vitamin (MULTIVITAMIN) tablet Take 1 tablet by mouth daily.     Psyllium (METAMUCIL) 28.3 % POWD Take qd     No facility-administered medications prior to visit.    Allergies  Allergen Reactions   Lisinopril Swelling    Lip swelling    ROS Review of Systems  Constitutional: Negative.   HENT: Negative.    Respiratory: Negative.    Cardiovascular: Negative.   Gastrointestinal: Negative.   Endocrine: Negative for polyphagia and polyuria.  Genitourinary: Negative.   Musculoskeletal:  Negative for gait problem and joint swelling.  Skin:  Negative for color change and pallor.  Neurological:  Negative for speech difficulty and weakness.     Depression screen [facility] 2/9 02/06/2021 02/06/2021 01/17/2021   Decreased Interest 0 0 0  Down, Depressed, Hopeless 0 0 0  PHQ - 2 Score 0 0 0  Altered sleeping 0 - -  Tired, decreased energy 0 - -  Change in appetite 0 - -  Feeling bad or failure about yourself  0 - -  Trouble concentrating 0 - -  Moving slowly or fidgety/restless 0 - -  Suicidal thoughts 0 - -  PHQ-9 Score 0 - -  Difficult doing work/chores Not difficult at all - -     Objective:    Physical Exam Vitals and nursing note reviewed.  Constitutional:      General: She is not in acute distress.    Appearance: Normal appearance. She is normal weight. She is not ill-appearing, toxic-appearing or diaphoretic.  HENT:     Head: Normocephalic and atraumatic.     Right Ear: Tympanic membrane, ear canal and external ear normal.     Left Ear: Tympanic membrane, ear canal and external ear normal.     Mouth/Throat:     Mouth: Mucous membranes are moist.     Pharynx: Oropharynx is clear. No oropharyngeal exudate or posterior oropharyngeal erythema.  Eyes:     General: No scleral icterus.       Right eye: No discharge.        Left eye: No discharge.     Extraocular Movements: Extraocular movements intact.     Conjunctiva/sclera: Conjunctivae normal.     Pupils: Pupils are equal, round, and reactive to light.  Neck:     Vascular: No carotid bruit.  Cardiovascular:     Rate and Rhythm: Normal rate and regular rhythm.  Pulmonary:     Effort: Pulmonary effort is normal.     Breath sounds: Normal breath sounds.  Abdominal:     General: Bowel sounds are normal.  Musculoskeletal:     Cervical back: No rigidity or tenderness.     Right lower leg: No edema.     Left lower leg: No edema.  Lymphadenopathy:     Cervical: No cervical adenopathy.  Skin:    General: Skin is warm and dry.  Neurological:     Mental Status: She is alert and oriented to person, place, and time.  Psychiatric:        Mood and Affect: Mood normal.        Behavior: Behavior normal.    BP 132/70 (BP  Location: Right Arm, Patient Position: Sitting, Cuff Size: Normal)    Pulse (!) 52  Temp (!) 97 F (36.1 C) (Temporal)    Ht 5\' 7"  (1.702 m)    Wt 173 lb 12.8 oz (78.8 kg)    SpO2 97%    BMI 27.22 kg/m  Wt Readings from Last 3 Encounters:  02/06/21 173 lb 12.8 oz (78.8 kg)  01/17/21 172 lb 9.6 oz (78.3 kg)  08/24/20 167 lb 8 oz (76 kg)     Health Maintenance Due  Topic Date Due   Zoster Vaccines- Shingrix (1 of 2) Never done   PAP SMEAR-Modifier  01/24/2021   Pneumonia Vaccine 73+ Years old (2 - PPSV23 if available, else PCV20) 02/13/2021    There are no preventive care reminders to display for this patient.  Lab Results  Component Value Date   TSH 0.89 02/01/2020   Lab Results  Component Value Date   WBC 3.9 (L) 02/01/2020   HGB 12.8 02/01/2020   HCT 38.0 02/01/2020   MCV 86.4 02/01/2020   PLT 207.0 02/01/2020   Lab Results  Component Value Date   NA 138 03/06/2020   K 3.9 03/06/2020   CO2 32 03/06/2020   GLUCOSE 127 (H) 03/06/2020   BUN 13 03/06/2020   CREATININE 1.22 (H) 03/06/2020   BILITOT 0.9 02/01/2020   ALKPHOS 51 02/01/2020   AST 21 02/01/2020   ALT 17 02/01/2020   PROT 7.1 02/01/2020   ALBUMIN 4.6 02/01/2020   CALCIUM 9.3 03/06/2020   ANIONGAP 9 05/25/2019   GFR 45.94 (L) 03/06/2020   Lab Results  Component Value Date   CHOL 183 02/01/2020   Lab Results  Component Value Date   HDL 48.50 02/01/2020   Lab Results  Component Value Date   LDLCALC 120 (H) 02/01/2020   Lab Results  Component Value Date   TRIG 71.0 02/01/2020   Lab Results  Component Value Date   CHOLHDL 4 02/01/2020   Lab Results  Component Value Date   HGBA1C 5.7 10/03/2016      Assessment & Plan:   Problem List Items Addressed This Visit       Cardiovascular and Mediastinum   Primary hypertension   Relevant Orders   Comprehensive metabolic panel   Urinalysis, Routine w reflex microscopic     Endocrine   Hypothyroidism   Relevant Orders   TSH     Other    Healthcare maintenance   Vitamin D deficiency - Primary   Relevant Orders   VITAMIN D 25 Hydroxy (Vit-D Deficiency, Fractures)   Hyperlipidemia, mixed   Relevant Orders   Comprehensive metabolic panel   LDL cholesterol, direct   Lipid panel   Absolute anemia   Relevant Orders   CBC   Iron, TIBC and Ferritin Panel   Other Visit Diagnoses     Need for vaccination against Streptococcus pneumoniae       Relevant Orders   Pneumococcal conjugate vaccine 20-valent (Prevnar 20)       No orders of the defined types were placed in this encounter.   Follow-up: Return in about 6 months (around 08/06/2021).  Fasting labs today.  Will return for Prevnar 20 next month.  Information given on health maintenance and disease prevention for those over 11.    Libby Maw, MD

## 2021-02-07 LAB — IRON,TIBC AND FERRITIN PANEL
%SAT: 26 % (calc) (ref 16–45)
Ferritin: 81 ng/mL (ref 16–288)
Iron: 89 ug/dL (ref 45–160)
TIBC: 347 mcg/dL (calc) (ref 250–450)

## 2021-02-11 ENCOUNTER — Other Ambulatory Visit: Payer: Self-pay

## 2021-02-11 DIAGNOSIS — E039 Hypothyroidism, unspecified: Secondary | ICD-10-CM

## 2021-02-11 MED ORDER — LEVOTHYROXINE SODIUM 50 MCG PO TABS: 50.0000 ug | ORAL_TABLET | Freq: Every day | ORAL | 3 refills | Status: DC

## 2021-02-20 ENCOUNTER — Ambulatory Visit (INDEPENDENT_AMBULATORY_CARE_PROVIDER_SITE_OTHER): Payer: Medicare HMO | Admitting: Obstetrics & Gynecology

## 2021-02-20 ENCOUNTER — Encounter: Payer: Self-pay | Admitting: Obstetrics & Gynecology

## 2021-02-20 ENCOUNTER — Telehealth: Payer: Self-pay | Admitting: Family Medicine

## 2021-02-20 ENCOUNTER — Other Ambulatory Visit: Payer: Self-pay

## 2021-02-20 VITALS — BP 116/53 | HR 57 | Wt 174.0 lb

## 2021-02-20 DIAGNOSIS — R319 Hematuria, unspecified: Secondary | ICD-10-CM

## 2021-02-20 DIAGNOSIS — I10 Essential (primary) hypertension: Secondary | ICD-10-CM

## 2021-02-20 DIAGNOSIS — Z01419 Encounter for gynecological examination (general) (routine) without abnormal findings: Secondary | ICD-10-CM | POA: Diagnosis not present

## 2021-02-20 LAB — POCT URINALYSIS DIPSTICK
Bilirubin, UA: NEGATIVE
Glucose, UA: NEGATIVE
Ketones, UA: NEGATIVE
Nitrite, UA: NEGATIVE
Protein, UA: NEGATIVE
Spec Grav, UA: 1.02 (ref 1.010–1.025)
Urobilinogen, UA: 0.2 E.U./dL
pH, UA: 6.5 (ref 5.0–8.0)

## 2021-02-20 MED ORDER — HYDROCHLOROTHIAZIDE 25 MG PO TABS: 25.0000 mg | ORAL_TABLET | Freq: Every day | ORAL | 3 refills | Status: DC

## 2021-02-20 NOTE — Telephone Encounter (Signed)
Pt is wanting her script to go to Pharmacy  [id number] - [place], [place] - [address]  [place], [place] [address]  Phone:  [phone]  Fax:  [phone]

## 2021-02-20 NOTE — Telephone Encounter (Signed)
Pt called and said [facility] said they contacted [place] twice about getting refill of hydrochlorothiazide (HYDRODIURIL) 25 MG tablet but I dont see where we received anything, Pt is now out of med and needs it sent in today to [place] [place] [address]  Phone:  [phone]  Fax:  [phone]   Pt would like a call back from nurse at [phone]

## 2021-02-20 NOTE — Progress Notes (Signed)
Subjective:     Lydia Joyce is a 71 y.o. female here for a routine exam.  Current complaints: none. Pt had colonoscopy and polyps were found. Not cancerous. She is scheduled to return in 3 years. She also had her BMD and mammogram. She has to get the 2nd pneumovax and her Shingles series but, is otherwise UTD with her vaccines. She has a new granddaughter names after her motherClyde Canterbury. .Pt reports occ pressure in her lower abd. No sx currently.     Gynecologic History No LMP recorded. Patient is postmenopausal. Contraception: post menopausal status Last Pap: 01/25/2020. Results were: normal Last mammogram: 11/02/2020. Results were: normal  Obstetric History OB History  Gravida Para Term Preterm AB Living  2         2  SAB IAB Ectopic Multiple Live Births          2    # Outcome Date GA Lbr Len/2nd Weight Sex Delivery Anes PTL Lv  2 Gravida      Vag-Spont   LIV  1 Gravida      Vag-Spont   LIV     The following portions of the patient's history were reviewed and updated as appropriate: allergies, current medications, past family history, past medical history, past social history, past surgical history, and problem list.  Review of Systems Pertinent items are noted in HPI.    Objective:  BP (!) 116/53    Pulse (!) 57    Wt 174 lb (78.9 kg)    BMI 27.25 kg/m   General Appearance:    Alert, cooperative, no distress, appears stated age  Head:    Normocephalic, without obvious abnormality, atraumatic  Eyes:    conjunctiva/corneas clear, EOM's intact, both eyes  Ears:    Normal external ear canals, both ears  Nose:   Nares normal, septum midline, mucosa normal, no drainage    or sinus tenderness  Throat:   Lips, mucosa, and tongue normal; teeth and gums normal  Neck:   Supple, symmetrical, trachea midline, no adenopathy;    thyroid:  no enlargement/tenderness/nodules  Back:     Symmetric, no curvature, ROM normal, no CVA tenderness  Lungs:     respirations unlabored  Chest Wall:     No tenderness or deformity   Heart:    Regular rate and rhythm  Breast Exam:    No tenderness, masses, or nipple abnormality  Abdomen:     Soft, non-tender, bowel sounds active all four quadrants,    no masses, no organomegaly  Genitalia:    Normal female without lesion, discharge or tenderness     Extremities:   Extremities normal, atraumatic, no cyanosis or edema  Pulses:   2+ and symmetric all extremities  Skin:   Skin color, texture, turgor normal, no rashes or lesions   UA: trace blood  Assessment:    Healthy female exam.  Microscopic hematuria.    Plan:  Reviewed vaccines F/u in 1 year or sooner prn  UA to lab.   Marico Buckle L. Harraway-Smith, M.D., Cherlynn June

## 2021-02-20 NOTE — Telephone Encounter (Signed)
Requested Rx sent in, patient aware.

## 2021-02-21 ENCOUNTER — Ambulatory Visit (INDEPENDENT_AMBULATORY_CARE_PROVIDER_SITE_OTHER): Payer: Medicare HMO

## 2021-02-21 DIAGNOSIS — Z23 Encounter for immunization: Secondary | ICD-10-CM

## 2021-02-21 LAB — URINALYSIS, ROUTINE W REFLEX MICROSCOPIC
Bilirubin, UA: NEGATIVE
Glucose, UA: NEGATIVE
Ketones, UA: NEGATIVE
Leukocytes,UA: NEGATIVE
Nitrite, UA: NEGATIVE
Protein,UA: NEGATIVE
RBC, UA: NEGATIVE
Specific Gravity, UA: 1.011 (ref 1.005–1.030)
Urobilinogen, Ur: 0.2 mg/dL (ref 0.2–1.0)
pH, UA: 5.5 (ref 5.0–7.5)

## 2021-02-21 NOTE — Progress Notes (Signed)
Per orders of Dr Ethelene Hal, injection of Prevnar 20 given by [facility], cma.  Patient tolerated injection well.

## 2021-03-01 ENCOUNTER — Telehealth (INDEPENDENT_AMBULATORY_CARE_PROVIDER_SITE_OTHER): Payer: Medicare HMO | Admitting: Family Medicine

## 2021-03-01 ENCOUNTER — Encounter: Payer: Self-pay | Admitting: Family Medicine

## 2021-03-01 VITALS — Ht 67.0 in

## 2021-03-01 DIAGNOSIS — D709 Neutropenia, unspecified: Secondary | ICD-10-CM

## 2021-03-01 DIAGNOSIS — E559 Vitamin D deficiency, unspecified: Secondary | ICD-10-CM | POA: Diagnosis not present

## 2021-03-01 DIAGNOSIS — E039 Hypothyroidism, unspecified: Secondary | ICD-10-CM | POA: Diagnosis not present

## 2021-03-01 DIAGNOSIS — Z114 Encounter for screening for human immunodeficiency virus [HIV]: Secondary | ICD-10-CM | POA: Diagnosis not present

## 2021-03-01 DIAGNOSIS — I10 Essential (primary) hypertension: Secondary | ICD-10-CM | POA: Diagnosis not present

## 2021-03-01 DIAGNOSIS — R69 Illness, unspecified: Secondary | ICD-10-CM | POA: Diagnosis not present

## 2021-03-01 MED ORDER — AMLODIPINE BESYLATE 5 MG PO TABS: 5.0000 mg | ORAL_TABLET | Freq: Every day | ORAL | 0 refills | Status: DC

## 2021-03-01 MED ORDER — VITAMIN D3 125 MCG (5000 UT) PO CAPS: ORAL_CAPSULE | ORAL | 1 refills | Status: AC

## 2021-03-01 NOTE — Addendum Note (Signed)
Addended by: Jon Billings on: 03/01/2021 03:19 PM   Modules accepted: Orders

## 2021-03-01 NOTE — Progress Notes (Addendum)
Established Patient Office Visit  Subjective:  Patient ID: Lydia Joyce, female    DOB: 09-30-1950  Age: 71 y.o. MRN: [id number]  CC:  Chief Complaint  Patient presents with   Advice Only    HPI Lydia Joyce presents for a discussion of her lab work.  This is the first time I am seeing her labs.  She is currently taking at 1000 international units of vitamin D.  Continues with HCTZ and a high potassium diet.  WBC count has been depressed for 3 years or so.  Past Medical History:  Diagnosis Date   Arthritis    Chicken pox    Hypertension    Hypothyroidism    Persistent cough for 3 weeks or longer 02/24/2018   Thyroid disease    Vitamin D deficiency     Past Surgical History:  Procedure Laterality Date   BREAST LUMPECTOMY WITH RADIOACTIVE SEED LOCALIZATION Left 06/01/2019   Procedure: LEFT BREAST LUMPECTOMY WITH RADIOACTIVE SEED LOCALIZATION;  Surgeon: Jovita Kussmaul, MD;  Location: [facility];  Service: General;  Laterality: Left;   COLONOSCOPY  2009   HERNIA REPAIR     umbilical    Family History  Problem Relation Age of Onset   Arthritis Mother    Stroke Mother    Arthritis Father    Stroke Father    Hypertension Father    Thyroid disease Sister    Colon cancer Neg Hx    Pancreatic cancer Neg Hx    Esophageal cancer Neg Hx    Liver cancer Neg Hx    Rectal cancer Neg Hx    Stomach cancer Neg Hx     Social History   Socioeconomic History   Marital status: Divorced    Spouse name: Not on file   Number of children: Not on file   Years of education: Not on file   Highest education level: Not on file  Occupational History   Occupation: retired  Tobacco Use   Smoking status: Never   Smokeless tobacco: Never  Vaping Use   Vaping Use: Never used  Substance and Sexual Activity   Alcohol use: No   Drug use: No   Sexual activity: Not Currently    Partners: Male  Other Topics Concern   Not on file  Social History Narrative   Exercise--  walk, treadmill 2x a week   Social Determinants of Health   Financial Resource Strain: Not on file  Food Insecurity: Not on file  Transportation Needs: Not on file  Physical Activity: Not on file  Stress: Not on file  Social Connections: Not on file  Intimate Partner Violence: Not on file    Outpatient Medications Prior to Visit  Medication Sig Dispense Refill   Ascorbic Acid (VITAMIN C) 1000 MG tablet Take 1,000 mg by mouth daily.     aspirin EC 81 MG tablet Take 81 mg by mouth 2 (two) times a week.      Ferrous Sulfate (IRON) 325 (65 FE) MG TABS Take 1 tablet by mouth daily as needed.      levothyroxine (SYNTHROID) 50 MCG tablet Take 1 tablet (50 mcg total) by mouth daily. 90 tablet 3   Misc Natural Products (GLUCOSAMINE CHOND COMPLEX/MSM) TABS Take 1 tablet by mouth daily.     Multiple Vitamin (MULTIVITAMIN) tablet Take 1 tablet by mouth daily.     Psyllium (METAMUCIL) 28.3 % POWD Take qd     Cholecalciferol (VITAMIN D3) 5000 UNITS CAPS  Take by mouth.     hydrochlorothiazide (HYDRODIURIL) 25 MG tablet Take 1 tablet (25 mg total) by mouth daily. 90 tablet 3   No facility-administered medications prior to visit.    Allergies  Allergen Reactions   Lisinopril Swelling    Lip swelling    ROS Review of Systems  Constitutional:  Negative for diaphoresis, fatigue, fever and unexpected weight change.  HENT: Negative.    Eyes:  Negative for photophobia.  Respiratory: Negative.    Cardiovascular: Negative.   Gastrointestinal: Negative.   Neurological:  Negative for speech difficulty, weakness and numbness.  Psychiatric/Behavioral: Negative.       Objective:    Physical Exam Vitals and nursing note reviewed.  Constitutional:      General: She is not in acute distress.    Appearance: Normal appearance. She is not ill-appearing, toxic-appearing or diaphoretic.  HENT:     Head: Normocephalic and atraumatic.     Right Ear: External ear normal.     Left Ear: External ear  normal.  Pulmonary:     Effort: Pulmonary effort is normal.  Neurological:     Mental Status: She is alert.  Psychiatric:        Mood and Affect: Mood normal.        Behavior: Behavior normal.    Ht 5\' 7"  (1.702 m)    BMI 27.25 kg/m  Wt Readings from Last 3 Encounters:  02/20/21 174 lb (78.9 kg)  02/06/21 173 lb 12.8 oz (78.8 kg)  01/17/21 172 lb 9.6 oz (78.3 kg)     Health Maintenance Due  Topic Date Due   Zoster Vaccines- Shingrix (1 of 2) Never done   Pneumonia Vaccine 56+ Years old (2 - PPSV23 if available, else PCV20) 02/13/2021    There are no preventive care reminders to display for this patient.  Lab Results  Component Value Date   TSH 1.09 02/06/2021   Lab Results  Component Value Date   WBC 3.4 (L) 02/06/2021   HGB 12.2 02/06/2021   HCT 37.5 02/06/2021   MCV 86.9 02/06/2021   PLT 210.0 02/06/2021   Lab Results  Component Value Date   NA 138 02/06/2021   K 3.3 (L) 02/06/2021   CO2 33 (H) 02/06/2021   GLUCOSE 88 02/06/2021   BUN 15 02/06/2021   CREATININE 1.23 (H) 02/06/2021   BILITOT 0.6 02/06/2021   ALKPHOS 58 02/06/2021   AST 26 02/06/2021   ALT 23 02/06/2021   PROT 6.9 02/06/2021   ALBUMIN 4.2 02/06/2021   CALCIUM 9.7 02/06/2021   ANIONGAP 9 05/25/2019   GFR 45.19 (L) 02/06/2021   Lab Results  Component Value Date   CHOL 154 02/06/2021   Lab Results  Component Value Date   HDL 41.00 02/06/2021   Lab Results  Component Value Date   LDLCALC 102 (H) 02/06/2021   Lab Results  Component Value Date   TRIG 57.0 02/06/2021   Lab Results  Component Value Date   CHOLHDL 4 02/06/2021   Lab Results  Component Value Date   HGBA1C 5.7 10/03/2016   The 10-year ASCVD risk score (Arnett DK, et al., 2019) is: 7%   Values used to calculate the score:     Age: 60 years     Sex: Female     Is Non-Hispanic African American: Yes     Diabetic: No     Tobacco smoker: No     Systolic Blood Pressure: 361 mmHg     Is BP  treated: Yes     HDL  Cholesterol: 41 mg/dL     Total Cholesterol: 154 mg/dL    Assessment & Plan:   Problem List Items Addressed This Visit       Cardiovascular and Mediastinum   Primary hypertension   Relevant Medications   amLODipine (NORVASC) 5 MG tablet     Endocrine   Hypothyroidism - Primary   Relevant Medications   amLODipine (NORVASC) 5 MG tablet     Other   Screening for HIV (human immunodeficiency virus)   Vitamin D deficiency   Relevant Medications   Cholecalciferol (VITAMIN D3) 125 MCG (5000 UT) CAPS   Neutropenia (HCC)   Relevant Orders   Ambulatory referral to Hematology / Oncology    Meds ordered this encounter  Medications   amLODipine (NORVASC) 5 MG tablet    Sig: Take 1 tablet (5 mg total) by mouth daily.    Dispense:  90 tablet    Refill:  0   Cholecalciferol (VITAMIN D3) 125 MCG (5000 UT) CAPS    Sig: Take one daily.    Dispense:  90 capsule    Refill:  1    Follow-up: Return in about 6 weeks (around 04/12/2021).  Have discontinued HCTZ.  We will start amlodipine.  Increase vitamin D to 5000 international units daily.  Hematology consultation for neutropenia.  Asked patient to return for HIV screening.  She would like to hold off until she sees the hematologist.  Libby Maw, MD  Virtual Visit via Video Note  I connected with Lydia Joyce on 03/01/21 at  2:30 PM EST by a video enabled telemedicine application and verified that I am speaking with the correct person using two identifiers.  Location: Patient: home alone Provider: home alone   I discussed the limitations of evaluation and management by telemedicine and the availability of in person appointments. The patient expressed understanding and agreed to proceed.  History of Present Illness:    Observations/Objective:   Assessment and Plan:   Follow Up Instructions:    I discussed the assessment and treatment plan with the patient. The patient was provided an opportunity to ask questions  and all were answered. The patient agreed with the plan and demonstrated an understanding of the instructions.   The patient was advised to call back or seek an in-person evaluation if the symptoms worsen or if the condition fails to improve as anticipated.  I provided 25 minutes of non-face-to-face time during this encounter.   Libby Maw, MD

## 2021-03-06 ENCOUNTER — Ambulatory Visit (INDEPENDENT_AMBULATORY_CARE_PROVIDER_SITE_OTHER): Payer: Medicare HMO

## 2021-03-06 DIAGNOSIS — Z0000 Encounter for general adult medical examination without abnormal findings: Secondary | ICD-10-CM

## 2021-03-06 NOTE — Progress Notes (Signed)
Subjective:   Lydia Joyce is a 71 y.o. female who presents for Medicare Annual (Subsequent) preventive examination.   I connected with Tawni Pummel today by telephone and verified that I am speaking with the correct person using two identifiers. Location patient: home Location provider: work Persons participating in the virtual visit: patient, provider.   I discussed the limitations, risks, security and privacy concerns of performing an evaluation and management service by telephone and the availability of in person appointments. I also discussed with the patient that there may be a patient responsible charge related to this service. The patient expressed understanding and verbally consented to this telephonic visit.    Interactive audio and video telecommunications were attempted between this provider and patient, however failed, due to patient having technical difficulties OR patient did not have access to video capability.  We continued and completed visit with audio only.    Review of Systems     Cardiac Risk Factors include: advanced age (>35men, >80 women)     Objective:    Today's Vitals   There is no height or weight on file to calculate BMI.  Advanced Directives 03/06/2021 11/08/2019 06/01/2019 05/24/2019  Does Patient Have a Medical Advance Directive? Yes Yes Yes Yes  Type of Paramedic of [place];Living will [place];Living will [place] will  Does patient want to make changes to medical advance directive? - - No - Patient declined No - Patient declined  Copy of [place] in Chart? No - copy requested No - copy requested No - copy requested -  Would patient like information on creating a medical advance directive? - - - No - Patient declined    Current Medications (verified) Outpatient Encounter Medications as of 03/06/2021  Medication Sig   amLODipine (NORVASC) 5 MG tablet  Take 1 tablet (5 mg total) by mouth daily.   Ascorbic Acid (VITAMIN C) 1000 MG tablet Take 1,000 mg by mouth daily.   aspirin EC 81 MG tablet Take 81 mg by mouth 2 (two) times a week.    Cholecalciferol (VITAMIN D3) 125 MCG (5000 UT) CAPS Take one daily.   Ferrous Sulfate (IRON) 325 (65 FE) MG TABS Take 1 tablet by mouth daily as needed.    levothyroxine (SYNTHROID) 50 MCG tablet Take 1 tablet (50 mcg total) by mouth daily.   Misc Natural Products (GLUCOSAMINE CHOND COMPLEX/MSM) TABS Take 1 tablet by mouth daily.   Multiple Vitamin (MULTIVITAMIN) tablet Take 1 tablet by mouth daily.   Psyllium (METAMUCIL) 28.3 % POWD Take qd   No facility-administered encounter medications on file as of 03/06/2021.    Allergies (verified) Lisinopril   History: Past Medical History:  Diagnosis Date   Arthritis    Chicken pox    Hypertension    Hypothyroidism    Persistent cough for 3 weeks or longer 02/24/2018   Thyroid disease    Vitamin D deficiency    Past Surgical History:  Procedure Laterality Date   BREAST LUMPECTOMY WITH RADIOACTIVE SEED LOCALIZATION Left 06/01/2019   Procedure: LEFT BREAST LUMPECTOMY WITH RADIOACTIVE SEED LOCALIZATION;  Surgeon: Jovita Kussmaul, MD;  Location: [facility];  Service: General;  Laterality: Left;   COLONOSCOPY  2009   HERNIA REPAIR     umbilical   Family History  Problem Relation Age of Onset   Arthritis Mother    Stroke Mother    Arthritis Father    Stroke Father  Hypertension Father    Thyroid disease Sister    Colon cancer Neg Hx    Pancreatic cancer Neg Hx    Esophageal cancer Neg Hx    Liver cancer Neg Hx    Rectal cancer Neg Hx    Stomach cancer Neg Hx    Social History   Socioeconomic History   Marital status: Divorced    Spouse name: Not on file   Number of children: Not on file   Years of education: Not on file   Highest education level: Not on file  Occupational History   Occupation: retired  Tobacco Use   Smoking  status: Never   Smokeless tobacco: Never  Vaping Use   Vaping Use: Never used  Substance and Sexual Activity   Alcohol use: No   Drug use: No   Sexual activity: Not Currently    Partners: Male  Other Topics Concern   Not on file  Social History Narrative   Exercise-- walk, treadmill 2x a week   Social Determinants of Health   Financial Resource Strain: Low Risk    Difficulty of Paying Living Expenses: Not hard at all  Food Insecurity: No Food Insecurity   Worried About Charity fundraiser in the Last Year: Never true   [place] in the Last Year: Never true  Transportation Needs: No Transportation Needs   Lack of Transportation (Medical): No   Lack of Transportation (Non-Medical): No  Physical Activity: Sufficiently Active   Days of Exercise per Week: 3 days   Minutes of Exercise per Session: 50 min  Stress: No Stress Concern Present   Feeling of Stress : Not at all  Social Connections: Moderately Isolated   Frequency of Communication with Friends and Family: Twice a week   Frequency of Social Gatherings with Friends and Family: Twice a week   Attends Religious Services: More than 4 times per year   Active Member of [employer] or Organizations: No   Attends Music therapist: Never   Marital Status: Divorced    Tobacco Counseling Counseling given: Not Answered   Clinical Intake:  Pre-visit preparation completed: Yes  Pain : No/denies pain     Nutritional Risks: None Diabetes: No  How often do you need to have someone help you when you read instructions, pamphlets, or other written materials from your doctor or pharmacy?: 1 - Never What is the last grade level you completed in school?: college  Diabetic?no   Interpreter Needed?: No  Information entered by :: [place] of Daily Living In your present state of health, do you have any difficulty performing the following activities: 03/06/2021  Hearing? N  Vision? N  Difficulty  concentrating or making decisions? N  Walking or climbing stairs? N  Dressing or bathing? N  Doing errands, shopping? N  Preparing Food and eating ? N  Using the Toilet? N  In the past six months, have you accidently leaked urine? N  Do you have problems with loss of bowel control? N  Managing your Medications? N  Managing your Finances? N  Housekeeping or managing your Housekeeping? N  Some recent data might be hidden    Patient Care Team: Libby Maw, MD as PCP - General (Family Medicine) Elouise Munroe, MD as PCP - Cardiology (Cardiology)  Indicate any recent Medical Services you may have received from other than [facility] providers in the past year (date may be approximate).     Assessment:  This is a routine wellness examination for [place].  Hearing/Vision screen No results found.  Dietary issues and exercise activities discussed: Current Exercise Habits: Home exercise routine, Type of exercise: walking, Time (Minutes): 30, Frequency (Times/Week): 4, Weekly Exercise (Minutes/Week): 120, Intensity: Mild, Exercise limited by: None identified   Goals Addressed             This Visit's Progress    Patient Stated   On track    Work on Blood pressure control       Depression Screen PHQ 2/9 Scores 03/06/2021 03/06/2021 03/01/2021 02/06/2021 02/06/2021 01/17/2021 11/08/2019  PHQ - 2 Score 0 0 0 0 0 0 0  PHQ- 9 Score - - - 0 - - -    Fall Risk Fall Risk  03/06/2021 03/01/2021 02/06/2021 01/17/2021 11/08/2019  Falls in the past year? 0 0 0 0 0  Number falls in past yr: 0 0 0 - 0  Injury with Fall? 0 - - - 0  Risk for fall due to : No Fall Risks - - - -  Follow up Falls evaluation completed - - - Falls prevention discussed    FALL RISK PREVENTION PERTAINING TO THE HOME:  Any stairs in or around the home? No  If so, are there any without handrails? No  Home free of loose throw rugs in walkways, pet beds, electrical cords, etc? Yes  Adequate lighting in your home  to reduce risk of falls? Yes   ASSISTIVE DEVICES UTILIZED TO PREVENT FALLS:  Life alert? No  Use of a cane, walker or w/c? No  Grab bars in the bathroom? No  Shower chair or bench in shower? No  Elevated toilet seat or a handicapped toilet? No    Cognitive Function:  Normal cognitive status assessed by direct observation by this Nurse Health Advisor. No abnormalities found.        Immunizations Immunization History  Administered Date(s) Administered   Fluad Quad(high Dose 65+) 10/04/2018, 12/27/2019   Influenza, High Dose Seasonal PF 11/26/2017   Influenza, Quadrivalent, Recombinant, Inj, Pf 11/27/2016   Influenza,inj,Quad PF,6+ Mos 11/22/2015   Influenza-Unspecified 10/10/2013, 09/14/2014, 11/12/2020   PFIZER(Purple Top)SARS-COV-2 Vaccination 02/26/2019, 03/22/2019, 11/21/2019   Pneumococcal Conjugate-13 02/14/2020   Td 02/08/2004   Tdap 06/03/2010, 01/17/2021   Yellow Fever 09/27/2020    TDAP status: Up to date  Flu Vaccine status: Up to date  Pneumococcal vaccine status: Up to date  Covid-19 vaccine status: Completed vaccines  Qualifies for Shingles Vaccine? Yes   Zostavax completed No   Shingrix Completed?: No.    Education has been provided regarding the importance of this vaccine. Patient has been advised to call insurance company to determine out of pocket expense if they have not yet received this vaccine. Advised may also receive vaccine at local pharmacy or Health Dept. Verbalized acceptance and understanding.  Screening Tests Health Maintenance  Topic Date Due   Zoster Vaccines- Shingrix (1 of 2) Never done   Pneumonia Vaccine 75+ Years old (2 - PPSV23 if available, else PCV20) 02/13/2021   Fecal DNA (Cologuard)  10/22/2021   MAMMOGRAM  11/02/2021   PAP SMEAR-Modifier  02/20/2022   TETANUS/TDAP  01/18/2031   INFLUENZA VACCINE  Completed   DEXA SCAN  Completed   Hepatitis C Screening  Completed   HPV VACCINES  Aged Out   COLONOSCOPY (Pts 45-26yrs  Insurance coverage will need to be confirmed)  Discontinued   COVID-19 Vaccine  Discontinued    Health Maintenance  Health Maintenance  Due  Topic Date Due   Zoster Vaccines- Shingrix (1 of 2) Never done   Pneumonia Vaccine 21+ Years old (2 - PPSV23 if available, else PCV20) 02/13/2021    Colorectal cancer screening: Type of screening: Colonoscopy. Completed 10/23/2018. Repeat every 3 years  Mammogram status: Completed 11/02/2020. Repeat every year  Bone Density status: Completed 02/28/2020. Results reflect: Bone density results: OSTEOPENIA. Repeat every 5 years.  Lung Cancer Screening: (Low Dose CT Chest recommended if Age 50-80 years, 30 pack-year currently smoking OR have quit w/in 15years.) does not qualify.   Lung Cancer Screening Referral: n/a  Additional Screening:  Hepatitis C Screening: does not qualify; Completed 03/27/2015  Vision Screening: Recommended annual ophthalmology exams for early detection of glaucoma and other disorders of the eye. Is the patient up to date with their annual eye exam?  Yes  Who is the provider or what is the name of the office in which the patient attends annual eye exams? [facility]  If pt is not established with a provider, would they like to be referred to a provider to establish care? No .   Dental Screening: Recommended annual dental exams for proper oral hygiene  Community Resource Referral / Chronic Care Management: CRR required this visit?  No   CCM required this visit?  No      Plan:     I have personally reviewed and noted the following in the patients chart:   Medical and social history Use of alcohol, tobacco or illicit drugs  Current medications and supplements including opioid prescriptions.  Functional ability and status Nutritional status Physical activity Advanced directives List of other physicians Hospitalizations, surgeries, and ER visits in previous 12 months Vitals Screenings to include cognitive,  depression, and falls Referrals and appointments  In addition, I have reviewed and discussed with patient certain preventive protocols, quality metrics, and best practice recommendations. A written personalized care plan for preventive services as well as general preventive health recommendations were provided to patient.     Randel Pigg, LPN   06/19/3014   Nurse Notes: none

## 2021-03-06 NOTE — Patient Instructions (Signed)
Lydia Joyce , Thank you for taking time to come for your Medicare Wellness Visit. I appreciate your ongoing commitment to your health goals. Please review the following plan we discussed and let me know if I can assist you in the future.   Screening recommendations/referrals: Colonoscopy: 10/23/2018  due 2023 Mammogram: 11/02/2020 Bone Density: 02/28/2020 Recommended yearly ophthalmology/optometry visit for glaucoma screening and checkup Recommended yearly dental visit for hygiene and checkup  Vaccinations: Influenza vaccine: completed  Pneumococcal vaccine: completed  Tdap vaccine: 01/17/2021 Shingles vaccine: completed     Advanced directives: yes   Conditions/risks identified: none   Next appointment: none    Preventive Care 69 Years and Older, Female Preventive care refers to lifestyle choices and visits with your health care provider that can promote health and wellness. What does preventive care include? A yearly physical exam. This is also called an annual well check. Dental exams once or twice a year. Routine eye exams. Ask your health care provider how often you should have your eyes checked. Personal lifestyle choices, including: Daily care of your teeth and gums. Regular physical activity. Eating a healthy diet. Avoiding tobacco and drug use. Limiting alcohol use. Practicing safe sex. Taking low-dose aspirin every day. Taking vitamin and mineral supplements as recommended by your health care provider. What happens during an annual well check? The services and screenings done by your health care provider during your annual well check will depend on your age, overall health, lifestyle risk factors, and family history of disease. Counseling  Your health care provider may ask you questions about your: Alcohol use. Tobacco use. Drug use. Emotional well-being. Home and relationship well-being. Sexual activity. Eating habits. History of falls. Memory and ability  to understand (cognition). Work and work Statistician. Reproductive health. Screening  You may have the following tests or measurements: Height, weight, and BMI. Blood pressure. Lipid and cholesterol levels. These may be checked every 5 years, or more frequently if you are over 69 years old. Skin check. Lung cancer screening. You may have this screening every year starting at age 69 if you have a 30-pack-year history of smoking and currently smoke or have quit within the past 15 years. Fecal occult blood test (FOBT) of the stool. You may have this test every year starting at age 69. Flexible sigmoidoscopy or colonoscopy. You may have a sigmoidoscopy every 5 years or a colonoscopy every 10 years starting at age 69. Hepatitis C blood test. Hepatitis B blood test. Sexually transmitted disease (STD) testing. Diabetes screening. This is done by checking your blood sugar (glucose) after you have not eaten for a while (fasting). You may have this done every 1-3 years. Bone density scan. This is done to screen for osteoporosis. You may have this done starting at age 69. Mammogram. This may be done every 1-2 years. Talk to your health care provider about how often you should have regular mammograms. Talk with your health care provider about your test results, treatment options, and if necessary, the need for more tests. Vaccines  Your health care provider may recommend certain vaccines, such as: Influenza vaccine. This is recommended every year. Tetanus, diphtheria, and acellular pertussis (Tdap, Td) vaccine. You may need a Td booster every 10 years. Zoster vaccine. You may need this after age 69. Pneumococcal 13-valent conjugate (PCV13) vaccine. One dose is recommended after age 69. Pneumococcal polysaccharide (PPSV23) vaccine. One dose is recommended after age 69. Talk to your health care provider about which screenings and vaccines you need and  how often you need them. This information is not  intended to replace advice given to you by your health care provider. Make sure you discuss any questions you have with your health care provider. Document Released: 01/26/2015 Document Revised: 09/19/2015 Document Reviewed: 10/31/2014 Elsevier Interactive Patient Education  2017 [employer] Prevention in the Home Falls can cause injuries. They can happen to people of all ages. There are many things you can do to make your home safe and to help prevent falls. What can I do on the outside of my home? Regularly fix the edges of walkways and driveways and fix any cracks. Remove anything that might make you trip as you walk through a door, such as a raised step or threshold. Trim any bushes or trees on the path to your home. Use bright outdoor lighting. Clear any walking paths of anything that might make someone trip, such as rocks or tools. Regularly check to see if handrails are loose or broken. Make sure that both sides of any steps have handrails. Any raised decks and porches should have guardrails on the edges. Have any leaves, snow, or ice cleared regularly. Use sand or salt on walking paths during winter. Clean up any spills in your garage right away. This includes oil or grease spills. What can I do in the bathroom? Use night lights. Install grab bars by the toilet and in the tub and shower. Do not use towel bars as grab bars. Use non-skid mats or decals in the tub or shower. If you need to sit down in the shower, use a plastic, non-slip stool. Keep the floor dry. Clean up any water that spills on the floor as soon as it happens. Remove soap buildup in the tub or shower regularly. Attach bath mats securely with double-sided non-slip rug tape. Do not have throw rugs and other things on the floor that can make you trip. What can I do in the bedroom? Use night lights. Make sure that you have a light by your bed that is easy to reach. Do not use any sheets or blankets that are  too big for your bed. They should not hang down onto the floor. Have a firm chair that has side arms. You can use this for support while you get dressed. Do not have throw rugs and other things on the floor that can make you trip. What can I do in the kitchen? Clean up any spills right away. Avoid walking on wet floors. Keep items that you use a lot in easy-to-reach places. If you need to reach something above you, use a strong step stool that has a grab bar. Keep electrical cords out of the way. Do not use floor polish or wax that makes floors slippery. If you must use wax, use non-skid floor wax. Do not have throw rugs and other things on the floor that can make you trip. What can I do with my stairs? Do not leave any items on the stairs. Make sure that there are handrails on both sides of the stairs and use them. Fix handrails that are broken or loose. Make sure that handrails are as long as the stairways. Check any carpeting to make sure that it is firmly attached to the stairs. Fix any carpet that is loose or worn. Avoid having throw rugs at the top or bottom of the stairs. If you do have throw rugs, attach them to the floor with carpet tape. Make sure that you have  a light switch at the top of the stairs and the bottom of the stairs. If you do not have them, ask someone to add them for you. What else can I do to help prevent falls? Wear shoes that: Do not have high heels. Have rubber bottoms. Are comfortable and fit you well. Are closed at the toe. Do not wear sandals. If you use a stepladder: Make sure that it is fully opened. Do not climb a closed stepladder. Make sure that both sides of the stepladder are locked into place. Ask someone to hold it for you, if possible. Clearly mark and make sure that you can see: Any grab bars or handrails. First and last steps. Where the edge of each step is. Use tools that help you move around (mobility aids) if they are needed. These  include: Canes. Walkers. Scooters. Crutches. Turn on the lights when you go into a dark area. Replace any light bulbs as soon as they burn out. Set up your furniture so you have a clear path. Avoid moving your furniture around. If any of your floors are uneven, fix them. If there are any pets around you, be aware of where they are. Review your medicines with your doctor. Some medicines can make you feel dizzy. This can increase your chance of falling. Ask your doctor what other things that you can do to help prevent falls. This information is not intended to replace advice given to you by your health care provider. Make sure you discuss any questions you have with your health care provider. Document Released: 10/26/2008 Document Revised: 06/07/2015 Document Reviewed: 02/03/2014 Elsevier Interactive Patient Education  2017 [employer].

## 2021-03-10 ENCOUNTER — Encounter: Payer: Self-pay | Admitting: Family Medicine

## 2021-03-12 ENCOUNTER — Other Ambulatory Visit: Payer: Self-pay | Admitting: Family

## 2021-03-12 DIAGNOSIS — D709 Neutropenia, unspecified: Secondary | ICD-10-CM

## 2021-03-13 ENCOUNTER — Inpatient Hospital Stay: Payer: Medicare HMO | Attending: Hematology & Oncology

## 2021-03-13 ENCOUNTER — Other Ambulatory Visit: Payer: Self-pay

## 2021-03-13 ENCOUNTER — Encounter: Payer: Self-pay | Admitting: Family

## 2021-03-13 ENCOUNTER — Inpatient Hospital Stay (HOSPITAL_BASED_OUTPATIENT_CLINIC_OR_DEPARTMENT_OTHER): Payer: Medicare HMO | Admitting: Family

## 2021-03-13 VITALS — BP 153/63 | HR 58 | Temp 97.7°F | Resp 17 | Ht 67.0 in | Wt 172.0 lb

## 2021-03-13 DIAGNOSIS — I10 Essential (primary) hypertension: Secondary | ICD-10-CM

## 2021-03-13 DIAGNOSIS — D709 Neutropenia, unspecified: Secondary | ICD-10-CM

## 2021-03-13 DIAGNOSIS — D72819 Decreased white blood cell count, unspecified: Secondary | ICD-10-CM

## 2021-03-13 LAB — CBC WITH DIFFERENTIAL (CANCER CENTER ONLY)
Abs Immature Granulocytes: 0.02 10*3/uL (ref 0.00–0.07)
Basophils Absolute: 0 10*3/uL (ref 0.0–0.1)
Basophils Relative: 0 %
Eosinophils Absolute: 0.1 10*3/uL (ref 0.0–0.5)
Eosinophils Relative: 2 %
HCT: 37.1 % (ref 36.0–46.0)
Hemoglobin: 12.4 g/dL (ref 12.0–15.0)
Immature Granulocytes: 0 %
Lymphocytes Relative: 38 %
Lymphs Abs: 2 10*3/uL (ref 0.7–4.0)
MCH: 29.2 pg (ref 26.0–34.0)
MCHC: 33.4 g/dL (ref 30.0–36.0)
MCV: 87.3 fL (ref 80.0–100.0)
Monocytes Absolute: 0.5 10*3/uL (ref 0.1–1.0)
Monocytes Relative: 9 %
Neutro Abs: 2.7 10*3/uL (ref 1.7–7.7)
Neutrophils Relative %: 51 %
Platelet Count: 218 10*3/uL (ref 150–400)
RBC: 4.25 MIL/uL (ref 3.87–5.11)
RDW: 14.1 % (ref 11.5–15.5)
WBC Count: 5.3 10*3/uL (ref 4.0–10.5)
nRBC: 0 % (ref 0.0–0.2)

## 2021-03-13 LAB — CMP (CANCER CENTER ONLY)
ALT: 19 U/L (ref 0–44)
AST: 23 U/L (ref 15–41)
Albumin: 4 g/dL (ref 3.5–5.0)
Alkaline Phosphatase: 50 U/L (ref 38–126)
Anion gap: 7 (ref 5–15)
BUN: 13 mg/dL (ref 8–23)
CO2: 30 mmol/L (ref 22–32)
Calcium: 9.8 mg/dL (ref 8.9–10.3)
Chloride: 103 mmol/L (ref 98–111)
Creatinine: 1.1 mg/dL — ABNORMAL HIGH (ref 0.44–1.00)
GFR, Estimated: 55 mL/min — ABNORMAL LOW (ref >60)
Glucose, Bld: 91 mg/dL (ref 70–99)
Potassium: 4.4 mmol/L (ref 3.5–5.1)
Sodium: 140 mmol/L (ref 135–145)
Total Bilirubin: 0.8 mg/dL (ref 0.3–1.2)
Total Protein: 7.1 g/dL (ref 6.5–8.1)

## 2021-03-13 LAB — LACTATE DEHYDROGENASE: LDH: 172 U/L (ref 98–192)

## 2021-03-13 LAB — SAVE SMEAR(SSMR), FOR PROVIDER SLIDE REVIEW

## 2021-03-13 NOTE — Progress Notes (Signed)
Hematology/Oncology Consultation   Name: Lydia Joyce      MRN: [id number]    Location: Room/bed info not found  Date: 03/13/2021 Time:3:55 PM   REFERRING PHYSICIAN: Jon Billings, MD  REASON FOR CONSULT: Neutropenia    DIAGNOSIS: Benign hereditary leukopenia   HISTORY OF PRESENT ILLNESS: Ms. Lydia Joyce is a very pleasant 71 yo female originally from [place] with mild intermittent leukopenia over the last 4 years.  She is asymptomatic at this time.  No issue with frequent or recurrent infections.  No fever, chills, n/v, cough, rash, dizziness, SOB, chest pain, palpitations, abdominal pain or change sin bowel or bladder habits.  No blood loss noted. No bruising or petechiae.  No known family history of leukopenia.  No personal or known family history of cancer.  She is quite active and enjoys swimming several days a week.  She had her colonoscopy and EGD in May 2022. She had 6 benign colonic polyps removed. She was also noted to have esophagitis, gastritis and a small hiatal hernia.  She had an umbilical hernia repair at 71 years old without any issues.  Mammogram in October 2022 was negative. She has 2 sons.  No history of diabetes or thyroid disease.  No swelling, tenderness, numbness or tingling in her extremities.  No falls or syncope.  No smoking, ETOH or recreational drug use.  She has maintained a good appetite and is staying well hydrated. Her weight is stable at 172 lbs.   ROS: All other 10 point review of systems is negative.   PAST MEDICAL HISTORY:   Past Medical History:  Diagnosis Date   Arthritis    Chicken pox    Hypertension    Hypothyroidism    Persistent cough for 3 weeks or longer 02/24/2018   Thyroid disease    Vitamin D deficiency     ALLERGIES: Allergies  Allergen Reactions   Lisinopril Swelling    Lip swelling      MEDICATIONS:  Current Outpatient Medications on File Prior to Visit  Medication Sig Dispense Refill   amLODipine (NORVASC) 5 MG tablet  Take 1 tablet (5 mg total) by mouth daily. 90 tablet 0   Ascorbic Acid (VITAMIN C) 1000 MG tablet Take 1,000 mg by mouth daily.     aspirin EC 81 MG tablet Take 81 mg by mouth 2 (two) times a week.      Cholecalciferol (VITAMIN D3) 125 MCG (5000 UT) CAPS Take one daily. 90 capsule 1   Ferrous Sulfate (IRON) 325 (65 FE) MG TABS Take 1 tablet by mouth daily as needed.      levothyroxine (SYNTHROID) 50 MCG tablet Take 1 tablet (50 mcg total) by mouth daily. 90 tablet 3   Misc Natural Products (GLUCOSAMINE CHOND COMPLEX/MSM) TABS Take 1 tablet by mouth daily.     Multiple Vitamin (MULTIVITAMIN) tablet Take 1 tablet by mouth daily.     Psyllium (METAMUCIL) 28.3 % POWD Take qd     No current facility-administered medications on file prior to visit.     PAST SURGICAL HISTORY Past Surgical History:  Procedure Laterality Date   BREAST LUMPECTOMY WITH RADIOACTIVE SEED LOCALIZATION Left 06/01/2019   Procedure: LEFT BREAST LUMPECTOMY WITH RADIOACTIVE SEED LOCALIZATION;  Surgeon: Jovita Kussmaul, MD;  Location: [facility];  Service: General;  Laterality: Left;   COLONOSCOPY  2009   HERNIA REPAIR     umbilical    FAMILY HISTORY: Family History  Problem Relation Age of Onset  Arthritis Mother    Stroke Mother    Arthritis Father    Stroke Father    Hypertension Father    Thyroid disease Sister    Colon cancer Neg Hx    Pancreatic cancer Neg Hx    Esophageal cancer Neg Hx    Liver cancer Neg Hx    Rectal cancer Neg Hx    Stomach cancer Neg Hx     SOCIAL HISTORY:  reports that she has never smoked. She has never used smokeless tobacco. She reports that she does not drink alcohol and does not use drugs.  PERFORMANCE STATUS: The patient's performance status is 0 - Asymptomatic  PHYSICAL EXAM: Most Recent Vital Signs: Blood pressure (!) 153/63, pulse (!) 58, temperature 97.7 F (36.5 C), temperature source Oral, resp. rate 17, height 5\' 7"  (1.702 m), weight 172 lb (78  kg). BP (!) 153/63 (BP Location: Left Arm, Patient Position: Sitting)    Pulse (!) 58    Temp 97.7 F (36.5 C) (Oral)    Resp 17    Ht 5\' 7"  (1.702 m)    Wt 172 lb (78 kg)    BMI 26.94 kg/m   General Appearance:    Alert, cooperative, no distress, appears stated age  Head:    Normocephalic, without obvious abnormality, atraumatic  Eyes:    PERRL, conjunctiva/corneas clear, EOM's intact, fundi    benign, both eyes        Throat:   Lips, mucosa, and tongue normal; teeth and gums normal  Neck:   Supple, symmetrical, trachea midline, no adenopathy;    thyroid:  no enlargement/tenderness/nodules; no carotid   bruit or JVD  Back:     Symmetric, no curvature, ROM normal, no CVA tenderness  Lungs:     Clear to auscultation bilaterally, respirations unlabored  Chest Wall:    No tenderness or deformity   Heart:    Regular rate and rhythm, S1 and S2 normal, no murmur, rub   or gallop     Abdomen:     Soft, non-tender, bowel sounds active all four quadrants,    no masses, no organomegaly        Extremities:   Extremities normal, atraumatic, no cyanosis or edema  Pulses:   2+ and symmetric all extremities  Skin:   Skin color, texture, turgor normal, no rashes or lesions  Lymph nodes:   Cervical, supraclavicular, and axillary nodes normal  Neurologic:   CNII-XII intact, normal strength, sensation and reflexes    throughout    LABORATORY DATA:  Results for orders placed or performed in visit on 03/13/21 (from the past 48 hour(s))  CBC with Differential (Cancer Center Only)     Status: None   Collection Time: 03/13/21  2:54 PM  Result Value Ref Range   WBC Count 5.3 4.0 - 10.5 K/uL   RBC 4.25 3.87 - 5.11 MIL/uL   Hemoglobin 12.4 12.0 - 15.0 g/dL   HCT 37.1 36.0 - 46.0 %   MCV 87.3 80.0 - 100.0 fL   MCH 29.2 26.0 - 34.0 pg   MCHC 33.4 30.0 - 36.0 g/dL   RDW 14.1 11.5 - 15.5 %   Platelet Count 218 150 - 400 K/uL   nRBC 0.0 0.0 - 0.2 %   Neutrophils Relative % 51 %   Neutro Abs 2.7 1.7 -  7.7 K/uL   Lymphocytes Relative 38 %   Lymphs Abs 2.0 0.7 - 4.0 K/uL   Monocytes Relative 9 %  Monocytes Absolute 0.5 0.1 - 1.0 K/uL   Eosinophils Relative 2 %   Eosinophils Absolute 0.1 0.0 - 0.5 K/uL   Basophils Relative 0 %   Basophils Absolute 0.0 0.0 - 0.1 K/uL   Immature Granulocytes 0 %   Abs Immature Granulocytes 0.02 0.00 - 0.07 K/uL    Comment: Performed at [facility] Lab at [facility], [address], [place], [place] [address]  CMP ([place] only)     Status: Abnormal   Collection Time: 03/13/21  2:54 PM  Result Value Ref Range   Sodium 140 135 - 145 mmol/L   Potassium 4.4 3.5 - 5.1 mmol/L   Chloride 103 98 - 111 mmol/L   CO2 30 22 - 32 mmol/L   Glucose, Bld 91 70 - 99 mg/dL    Comment: Glucose reference range applies only to samples taken after fasting for at least 8 hours.   BUN 13 8 - 23 mg/dL   Creatinine 1.10 (H) 0.44 - 1.00 mg/dL   Calcium 9.8 8.9 - 10.3 mg/dL   Total Protein 7.1 6.5 - 8.1 g/dL   Albumin 4.0 3.5 - 5.0 g/dL   AST 23 15 - 41 U/L   ALT 19 0 - 44 U/L   Alkaline Phosphatase 50 38 - 126 U/L   Total Bilirubin 0.8 0.3 - 1.2 mg/dL   GFR, Estimated 55 (L) >60 mL/min    Comment: (NOTE) Calculated using the CKD-EPI Creatinine Equation (2021)    Anion gap 7 5 - 15    Comment: Performed at [facility] Lab at [facility], [address], [place], [place] [address]  Lactate dehydrogenase (LDH)     Status: None   Collection Time: 03/13/21  2:54 PM  Result Value Ref Range   LDH 172 98 - 192 U/L    Comment: Performed at [facility] Lab at [facility], [address], [place], [place] [address]  [facility])     Status: None   Collection Time: 03/13/21  2:54 PM  Result Value Ref Range   Smear Review SMEAR STAINED AND AVAILABLE FOR REVIEW     Comment: Performed at [facility] Lab at [facility], [address] [place], [place] [address]       RADIOGRAPHY: No results found.     PATHOLOGY: None  ASSESSMENT/PLAN: Ms. Poblano is a very pleasant 71 yo female originally from [place] with mild intermittent leukopenia over the last 4 years.  She has remained asymptomatic.  CBC, CMP and blood smear reviewed with Dr. Marin Olp. No abnormality or evidence of malignancy noted. WBC cells appear well developed and mature.  This is felt to be benign hereditary leukopenia. No follow-up needed.   All questions were answered. The patient knows to call the clinic with any problems, questions or concerns. We can certainly see her for an future heme/onc issues that may arise.   The patient was discussed with Dr. Marin Olp and he is in agreement with the aforementioned.   Lottie Dawson, NP

## 2021-03-14 ENCOUNTER — Telehealth: Payer: Self-pay | Admitting: Family Medicine

## 2021-03-14 NOTE — Telephone Encounter (Signed)
Pt called and said that she received bill for tdap done 01/17/21 and she called her insurance and they said it was coded wrong. She then said she was going to come in and pay it so she can get a reciept ?

## 2021-03-19 ENCOUNTER — Telehealth: Payer: Self-pay | Admitting: *Deleted

## 2021-03-19 NOTE — Telephone Encounter (Signed)
Per 03/13/21 - no follow up needed ?

## 2021-04-26 ENCOUNTER — Ambulatory Visit: Payer: Medicare HMO | Admitting: Family Medicine

## 2021-05-13 ENCOUNTER — Other Ambulatory Visit: Payer: Self-pay | Admitting: Family Medicine

## 2021-05-13 DIAGNOSIS — E039 Hypothyroidism, unspecified: Secondary | ICD-10-CM

## 2021-05-30 DIAGNOSIS — Z008 Encounter for other general examination: Secondary | ICD-10-CM | POA: Diagnosis not present

## 2021-05-30 DIAGNOSIS — E039 Hypothyroidism, unspecified: Secondary | ICD-10-CM | POA: Diagnosis not present

## 2021-05-30 DIAGNOSIS — I10 Essential (primary) hypertension: Secondary | ICD-10-CM | POA: Diagnosis not present

## 2021-05-30 DIAGNOSIS — Z8249 Family history of ischemic heart disease and other diseases of the circulatory system: Secondary | ICD-10-CM | POA: Diagnosis not present

## 2021-05-30 DIAGNOSIS — Z888 Allergy status to other drugs, medicaments and biological substances status: Secondary | ICD-10-CM | POA: Diagnosis not present

## 2021-05-30 DIAGNOSIS — E785 Hyperlipidemia, unspecified: Secondary | ICD-10-CM | POA: Diagnosis not present

## 2021-05-30 DIAGNOSIS — M1990 Unspecified osteoarthritis, unspecified site: Secondary | ICD-10-CM | POA: Diagnosis not present

## 2021-07-18 ENCOUNTER — Ambulatory Visit (INDEPENDENT_AMBULATORY_CARE_PROVIDER_SITE_OTHER): Payer: Medicare HMO

## 2021-07-18 DIAGNOSIS — R030 Elevated blood-pressure reading, without diagnosis of hypertension: Secondary | ICD-10-CM | POA: Diagnosis not present

## 2021-07-18 NOTE — Progress Notes (Signed)
Per order of Dr.Kremer pt is here for a BP Check. Pt received check in Left Arm at 2:50 pm. Pt is aware of Readings both manual and automatic.   Manual: 142/80  Automatic: 167/80   Readings on both systolic sides were off by 25 points pt stated automatic home cuff was an older model around 34-69 yo. I then  suggested getting a new Automatic cuff so that it may be more accurate. That she may return to have BP rechecked with automatic and manual BP cuff's.

## 2021-08-02 ENCOUNTER — Other Ambulatory Visit (HOSPITAL_COMMUNITY): Payer: Self-pay | Admitting: Nurse Practitioner

## 2021-08-02 DIAGNOSIS — I739 Peripheral vascular disease, unspecified: Secondary | ICD-10-CM

## 2021-08-08 ENCOUNTER — Ambulatory Visit (HOSPITAL_COMMUNITY)
Admission: RE | Admit: 2021-08-08 | Discharge: 2021-08-08 | Disposition: A | Discharge status: Home / Self Care | Payer: Medicare HMO | Source: Ambulatory Visit | Attending: Nurse Practitioner | Admitting: Nurse Practitioner

## 2021-08-08 ENCOUNTER — Ambulatory Visit: Payer: Medicare HMO | Admitting: Family Medicine

## 2021-08-08 DIAGNOSIS — I739 Peripheral vascular disease, unspecified: Secondary | ICD-10-CM | POA: Diagnosis not present

## 2021-08-08 NOTE — Progress Notes (Signed)
ABI's have been completed. Preliminary results can be found in CV Proc through chart review.   08/08/21 3:09 PM Lydia Joyce RVT

## 2021-08-12 ENCOUNTER — Other Ambulatory Visit: Payer: Self-pay | Admitting: Family Medicine

## 2021-08-12 ENCOUNTER — Ambulatory Visit: Payer: Medicare HMO | Admitting: Family Medicine

## 2021-08-12 DIAGNOSIS — E039 Hypothyroidism, unspecified: Secondary | ICD-10-CM

## 2021-09-09 NOTE — Telephone Encounter (Signed)
Na

## 2021-12-25 ENCOUNTER — Telehealth: Payer: Self-pay | Admitting: General Practice

## 2021-12-25 NOTE — Telephone Encounter (Signed)
Left message on VM for patient to contact our office to schedule annual exam with Dr. Eugenie Norrie in Feb 2024.  Last annual was 02/20/2021.

## 2021-12-25 NOTE — Telephone Encounter (Signed)
-----   Message from Maurine Minister, [place] sent at 12/23/2021  9:46 AM EST ----- Regarding: Annual Last annual 02/20/2021.  Schedule with Dr. Eugenie Norrie in Feb.

## 2022-02-12 ENCOUNTER — Telehealth: Payer: Self-pay | Admitting: Family Medicine

## 2022-02-12 NOTE — Telephone Encounter (Signed)
Left message asking pt to call [phone]  Patient due for AWV.  I wanted to confirm patient is still a patient @ [facility].   Chart has different provider as pcp

## 2022-02-12 NOTE — Telephone Encounter (Signed)
Patient returned my call   she did change pcp

## 2022-02-24 ENCOUNTER — Telehealth: Payer: Self-pay | Admitting: Family Medicine

## 2022-02-24 NOTE — Telephone Encounter (Signed)
Pt is stating she was seen on 02/21/18 for a nurse visit for the 2nd dose of Prevnar 20 per Dr. Ethelene Hal. There is no record of this in her mychart. Her mychart is telling her she needs another shot for this. Please advise pt at [phone] if any further questions. She is wanting this sent to [facility]. Joesphine Bare has already requested this.   She is also wanting a copy of this for herself. Please notify pt when ready. She was a pt of Dr Ethelene Hal.

## 2022-02-24 NOTE — Telephone Encounter (Signed)
Please advise message below looks like patient may have come in for nurse visit for Prevnar 20 given but not recorded in chart.

## 2022-02-25 NOTE — Telephone Encounter (Signed)
Immunizations faxed to [facility] @ [phone].  Dm/cma

## 2022-02-25 NOTE — Addendum Note (Signed)
Addended by: Konrad Saha on: 02/25/2022 09:32 AM   Modules accepted: Orders

## 2022-02-25 NOTE — Telephone Encounter (Signed)
Went back and documented the Pneumo vaccine that was done on 02/21/21.  Left VM to rtn call to get a number to where documentation needs sent to.  Dm/cma

## 2022-02-25 NOTE — Telephone Encounter (Signed)
[facility]'s fax # [phone]

## 2022-02-26 ENCOUNTER — Ambulatory Visit (INDEPENDENT_AMBULATORY_CARE_PROVIDER_SITE_OTHER): Payer: Medicare HMO | Admitting: Obstetrics & Gynecology

## 2022-02-26 ENCOUNTER — Encounter: Payer: Self-pay | Admitting: Obstetrics & Gynecology

## 2022-02-26 VITALS — BP 137/61 | HR 55 | Ht 67.5 in | Wt 174.0 lb

## 2022-02-26 DIAGNOSIS — Z01419 Encounter for gynecological examination (general) (routine) without abnormal findings: Secondary | ICD-10-CM

## 2022-02-26 DIAGNOSIS — R7989 Other specified abnormal findings of blood chemistry: Secondary | ICD-10-CM | POA: Insufficient documentation

## 2022-02-26 DIAGNOSIS — Z1231 Encounter for screening mammogram for malignant neoplasm of breast: Secondary | ICD-10-CM

## 2022-02-26 NOTE — Progress Notes (Signed)
Last Mammogram 11/02/20 Last pap: 01/25/20  Patient states her new PCP is [facility] in [place] (off [address]).

## 2022-02-26 NOTE — Progress Notes (Signed)
Subjective:     Lydia Joyce is a 72 y.o. female here for a routine exam.  Current complaints: no GYN problems. Her older son and DIL are living with her. She is content and gets along with them well.   No GYN issues.    Gynecologic History No LMP recorded. Patient is postmenopausal. Contraception: post menopausal status Last Mammogram 11/02/20 WNL Last pap: 01/25/20 WNL   Obstetric History OB History  Gravida Para Term Preterm AB Living  2         2  SAB IAB Ectopic Multiple Live Births          2    # Outcome Date GA Lbr Len/2nd Weight Sex Delivery Anes PTL Lv  2 Gravida      Vag-Spont   LIV  1 Gravida      Vag-Spont   LIV   The following portions of the patient's history were reviewed and updated as appropriate: allergies, current medications, past family history, past medical history, past social history, past surgical history, and problem list.  Review of Systems Pertinent items are noted in HPI.    Objective:  BP 137/61   Pulse (!) 55   Ht 5' 7.5" (1.715 m)   Wt 174 lb (78.9 kg)   BMI 26.85 kg/m   General Appearance:    Alert, cooperative, no distress, appears stated age  Head:    Normocephalic, without obvious abnormality, atraumatic  Eyes:    conjunctiva/corneas clear, EOM's intact, both eyes  Ears:    Normal external ear canals, both ears  Nose:   Nares normal, septum midline, mucosa normal, no drainage    or sinus tenderness  Throat:   Lips, mucosa, and tongue normal; teeth and gums normal  Neck:   Supple, symmetrical, trachea midline, no adenopathy;    thyroid:  no enlargement/tenderness/nodules  Back:     Symmetric, no curvature, ROM normal, no CVA tenderness  Lungs:     respirations unlabored  Chest Wall:    No tenderness or deformity   Heart:    Regular rate and rhythm  Breast Exam:    No tenderness, masses, or nipple abnormality  Abdomen:     Soft, non-tender, bowel sounds active all four quadrants,    no masses, no organomegaly  Genitalia:    Normal  female without lesion, discharge or tenderness     Extremities:   Extremities normal, atraumatic, no cyanosis or edema  Pulses:   2+ and symmetric all extremities  Skin:   Skin color, texture, turgor normal, no rashes or lesions     Assessment:    Healthy female exam.    Plan:   Lydia Joyce was seen today for gynecologic exam.  Diagnoses and all orders for this visit:  Well female exam with routine gynecological exam  Breast cancer screening by mammogram -     MM Digital Screening; Future  Elevated serum creatinine  Reviewed the elevated Cr since 2014. Pt was not aware. She has HTN but, feels that it has been controlled. I have reccommended that she f/u with Nephrology so that they can keep an eye on this.   Bera Pinela L. Harraway-Smith, M.D., Cherlynn June

## 2022-03-07 ENCOUNTER — Telehealth (HOSPITAL_BASED_OUTPATIENT_CLINIC_OR_DEPARTMENT_OTHER): Payer: Self-pay

## 2022-03-17 ENCOUNTER — Other Ambulatory Visit: Payer: Self-pay | Admitting: Family Medicine

## 2022-03-17 DIAGNOSIS — Z1231 Encounter for screening mammogram for malignant neoplasm of breast: Secondary | ICD-10-CM

## 2022-04-18 ENCOUNTER — Encounter: Payer: Self-pay | Admitting: Obstetrics & Gynecology

## 2022-04-29 ENCOUNTER — Telehealth (HOSPITAL_BASED_OUTPATIENT_CLINIC_OR_DEPARTMENT_OTHER): Payer: Self-pay

## 2022-05-23 ENCOUNTER — Other Ambulatory Visit: Payer: Self-pay | Admitting: Nurse Practitioner

## 2022-05-23 DIAGNOSIS — E2839 Other primary ovarian failure: Secondary | ICD-10-CM

## 2022-09-01 ENCOUNTER — Ambulatory Visit
Admission: RE | Admit: 2022-09-01 | Discharge: 2022-09-01 | Disposition: A | Discharge status: Home / Self Care | Payer: Medicare HMO | Source: Ambulatory Visit | Attending: Nurse Practitioner | Admitting: Nurse Practitioner

## 2022-09-01 DIAGNOSIS — E2839 Other primary ovarian failure: Secondary | ICD-10-CM

## 2022-10-21 IMAGING — DX DG FINGER INDEX 2+V*R*
3 series · 3 of 3 positions shown · non-contrast
Comparison: None.

CLINICAL DATA: Right index finger pain for 3 weeks

EXAM:
RIGHT INDEX FINGER 2+V

[finger pa]
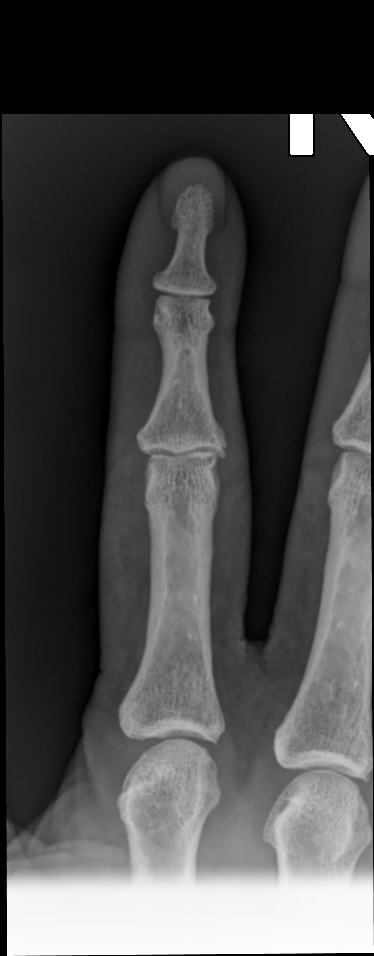

[finger mlo]
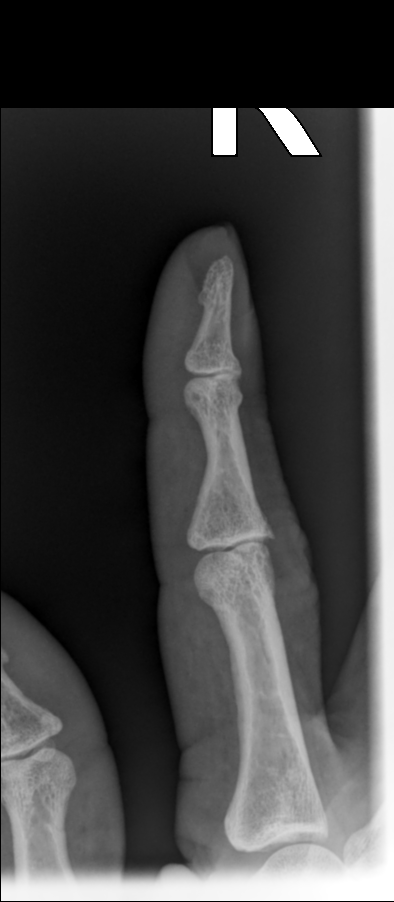

[finger lat]
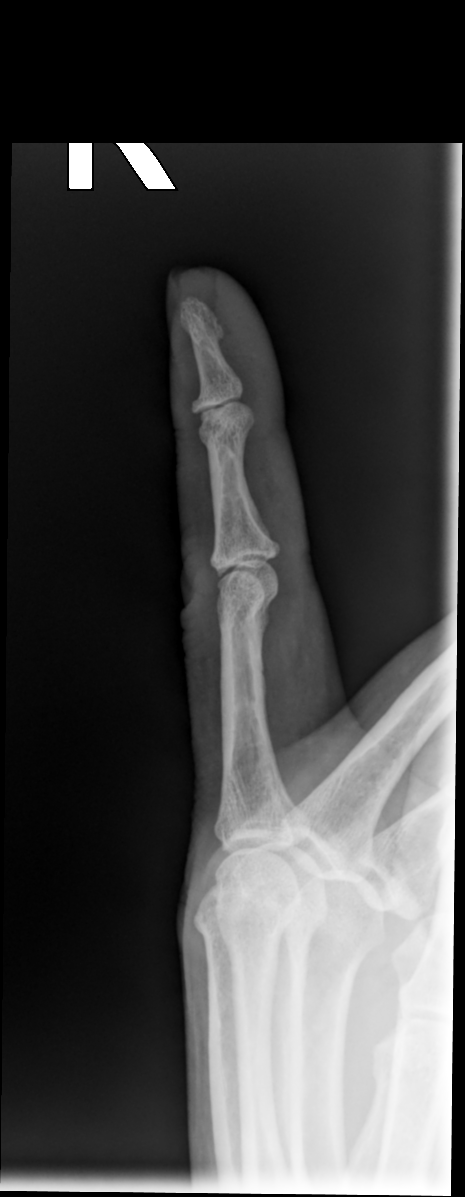

[3 of 3 positions shown; findings below may reference images not displayed]

FINDINGS: Frontal, oblique, and lateral views of the right second digit are
obtained. There are no acute or destructive bony lesions. Prominent
osteoarthritis greatest at the second proximal interphalangeal
joint. Soft tissues are unremarkable. No radiopaque foreign body.
IMPRESSION: 1. Osteoarthritis most pronounced at the second proximal
interphalangeal joint. No acute process.

## 2022-12-10 ENCOUNTER — Ambulatory Visit: Payer: Medicare HMO

## 2022-12-10 ENCOUNTER — Ambulatory Visit: Payer: Medicare HMO | Attending: Internal Medicine | Admitting: Internal Medicine

## 2022-12-10 ENCOUNTER — Encounter: Payer: Self-pay | Admitting: Internal Medicine

## 2022-12-10 VITALS — BP 122/80 | HR 60 | Ht 67.0 in | Wt 161.0 lb

## 2022-12-10 DIAGNOSIS — I10 Essential (primary) hypertension: Secondary | ICD-10-CM | POA: Diagnosis not present

## 2022-12-10 NOTE — Progress Notes (Signed)
Cardiology Office Note:  .   Date:  12/10/2022  ID:  Lydia Joyce, DOB 04-Nov-1950, MRN [id number] PCP: Lydia Revere, MD  Lydia Joyce Providers Cardiologist:  Parke Poisson, MD    History of Present Illness: .   Lydia Joyce is a 72 y.o. female.  Discussed the use of AI scribe software for clinical note transcription with the patient, who gave verbal consent to proceed.  History of Present Illness   The patient, with a history of hypertension, hypoglycemia, and thyroid disease, presents for a routine check-up to ensure good heart health. She reports no recent changes in her health status and denies any chest discomfort or excessive shortness of breath. She maintains an active lifestyle, including swimming and aerobics, and reports no exercise-induced symptoms.  The patient's hypertension is managed with amlodipine 5mg  daily, having previously been on hydrochlorothiazide. The change in medication was due to concerns about elevated creatinine levels and low potassium, which have since improved. The patient's blood pressure readings at home are typically in the 120s to 130s, although today's reading was slightly elevated, which she attributes to rushing and stress related to finding the clinic.  Regarding her cholesterol, the patient is committed to managing it through diet and exercise. She reports recent cholesterol labs were normal, although the exact numbers are not available at this time. The patient plans to send the recent lab results to the doctor for review.        ROS: negative except per HPI above.  Studies Reviewed: Marland Kitchen   EKG Interpretation Date/Time:  Wednesday December 10 2022 13:42:40 EST Ventricular Rate:  60 PR Interval:  142 QRS Duration:  84 QT Interval:  410 QTC Calculation: 410 R Axis:   59  Text Interpretation: Normal sinus rhythm Normal ECG Confirmed by Weston Brass ([id number]) on 12/10/2022 2:18:36 PM    Results   LABS K: normal (March  2023) LDL: 102 mg/dL (January 1914)  DIAGNOSTIC EKG: Normal (12/10/2022) Echocardiogram: Normal     Risk Assessment/Calculations:     HYPERTENSION CONTROL Vitals:   12/10/22 1344 12/10/22 1347  BP: (!) 160/80 (!) 140/72    The patient's blood pressure is elevated above target today.  In order to address the patient's elevated BP: The blood pressure is usually elevated in clinic.  Blood pressures monitored at home have been optimal.          Physical Exam:   VS:  BP (!) 140/72 (BP Location: Left Arm, Patient Position: Sitting, Cuff Size: Normal)   Pulse 60   Ht 5\' 7"  (1.702 m)   Wt 161 lb (73 kg)   SpO2 99%   BMI 25.22 kg/m    Wt Readings from Last 3 Encounters:  12/10/22 161 lb (73 kg)  02/26/22 174 lb (78.9 kg)  03/13/21 172 lb (78 kg)     Physical Exam   CHEST: Lungs clear to auscultation. CARDIOVASCULAR: Heart sounds normal.     GEN: Well nourished, well developed in no acute distress NECK: No JVD; No carotid bruits CARDIAC: RRR, no murmurs, rubs, gallops RESPIRATORY:  Clear to auscultation without rales, wheezing or rhonchi  ABDOMEN: Soft, non-tender, non-distended EXTREMITIES:  No edema; No deformity   ASSESSMENT AND PLAN: .    1. Primary hypertension     Assessment and Plan    Hypertension Elevated blood pressure in clinic, but patient reports normal readings at home. No symptoms of end-organ damage. Currently on Amlodipine 5mg  daily. -Recheck blood pressure at end of  visit. -Continue Amlodipine 5mg  daily. -Encourage home blood pressure monitoring.  Hyperlipidemia Last LDL was 102, goal is 70. Patient is not on statin therapy and prefers to manage with diet and exercise. -Request recent cholesterol labs from primary care provider. -Encourage diet and exercise to lower LDL. -Consider coronary artery calcium scoring for further risk stratification if needed.  Hypothyroidism Patient is on levothyroxine, no current symptoms reported. -Continue  current management.  General Health Maintenance -Continue regular exercise. -Schedule follow-up visit in 1 year.                 Weston Brass, MD, [facility] [place]  [facility]

## 2022-12-10 NOTE — Patient Instructions (Signed)
Medication Instructions:  Your physician recommends that you continue on your current medications as directed. Please refer to the Current Medication list given to you today.  *If you need a refill on your cardiac medications before your next appointment, please call your pharmacy*   Follow-Up: At [facility], you and your health needs are our priority.  As part of our continuing mission to provide you with exceptional heart care, we have created designated Provider Care Teams.  These Care Teams include your primary Cardiologist (physician) and Advanced Practice Providers (APPs -  Physician Assistants and Nurse Practitioners) who all work together to provide you with the care you need, when you need it.  We recommend signing up for the patient portal called "MyChart".  Sign up information is provided on this After Visit Summary.  MyChart is used to connect with patients for Virtual Visits (Telemedicine).  Patients are able to view lab/test results, encounter notes, upcoming appointments, etc.  Non-urgent messages can be sent to your provider as well.   To learn more about what you can do with MyChart, go to [email or web address]://[email or web address].com.    Your next appointment:   12 month(s)  Provider:   Gayatri A Acharya, MD    

## 2022-12-15 ENCOUNTER — Ambulatory Visit
Admission: RE | Admit: 2022-12-15 | Discharge: 2022-12-15 | Disposition: A | Discharge status: Home / Self Care | Payer: Medicare HMO | Source: Ambulatory Visit | Attending: Family Medicine

## 2022-12-15 DIAGNOSIS — Z1231 Encounter for screening mammogram for malignant neoplasm of breast: Secondary | ICD-10-CM

## 2023-02-18 ENCOUNTER — Telehealth: Payer: Self-pay

## 2023-02-18 NOTE — Telephone Encounter (Signed)
 Left voicemail for patient to call back and schedule her annual exam.

## 2023-03-16 ENCOUNTER — Encounter: Payer: Self-pay | Admitting: Internal Medicine

## 2023-04-03 ENCOUNTER — Other Ambulatory Visit: Payer: Self-pay | Admitting: Nurse Practitioner

## 2023-04-03 DIAGNOSIS — Z1231 Encounter for screening mammogram for malignant neoplasm of breast: Secondary | ICD-10-CM

## 2023-05-13 ENCOUNTER — Ambulatory Visit (AMBULATORY_SURGERY_CENTER)

## 2023-05-13 VITALS — Ht 67.0 in | Wt 165.0 lb

## 2023-05-13 DIAGNOSIS — Z860100 Personal history of colon polyps, unspecified: Secondary | ICD-10-CM

## 2023-05-13 NOTE — Progress Notes (Signed)

## 2023-05-25 ENCOUNTER — Telehealth: Payer: Self-pay | Admitting: Internal Medicine

## 2023-05-25 NOTE — Telephone Encounter (Signed)
 Patient requesting a call to discuss insurance coverage for 5/30 colonoscopy. Requesting to know if she will have a deductible. Please advise, thank you.

## 2023-05-25 NOTE — Telephone Encounter (Signed)
 PT called back and said that we gave her the wrong information. She that she does not have a $300 deductible and that it should be coded as preventative. Please advise.

## 2023-05-26 ENCOUNTER — Encounter: Payer: Self-pay | Admitting: Internal Medicine

## 2023-06-09 ENCOUNTER — Encounter: Payer: Self-pay | Admitting: Internal Medicine

## 2023-06-10 ENCOUNTER — Telehealth: Payer: Self-pay | Admitting: Gastroenterology

## 2023-06-10 NOTE — Telephone Encounter (Signed)
 Pt wanted to clarify if drinking light blue and orange Gatorade is OK. RN reassured her they are fine and reviewed the no red purple. Pt stated she understood ad had no other questions at this time.

## 2023-06-10 NOTE — Telephone Encounter (Signed)
 Patient called and stated that she was wanting to know when it was ok to drink or mix her little blue or orange Gatorade. Patient is schedule for a procedure on May the 30 th. Patient is requesting a call back. Please advise.

## 2023-06-12 ENCOUNTER — Ambulatory Visit: Admitting: Internal Medicine

## 2023-06-12 ENCOUNTER — Encounter: Payer: Self-pay | Admitting: Internal Medicine

## 2023-06-12 VITALS — BP 134/78 | HR 64 | Temp 97.3°F | Resp 18 | Ht 67.0 in | Wt 165.0 lb

## 2023-06-12 DIAGNOSIS — Z860101 Personal history of adenomatous and serrated colon polyps: Secondary | ICD-10-CM

## 2023-06-12 DIAGNOSIS — K648 Other hemorrhoids: Secondary | ICD-10-CM

## 2023-06-12 DIAGNOSIS — Z1211 Encounter for screening for malignant neoplasm of colon: Secondary | ICD-10-CM | POA: Diagnosis present

## 2023-06-12 DIAGNOSIS — Z860100 Personal history of colon polyps, unspecified: Secondary | ICD-10-CM

## 2023-06-12 DIAGNOSIS — D123 Benign neoplasm of transverse colon: Secondary | ICD-10-CM

## 2023-06-12 DIAGNOSIS — D120 Benign neoplasm of cecum: Secondary | ICD-10-CM | POA: Diagnosis not present

## 2023-06-12 MED ORDER — SODIUM CHLORIDE 0.9 % IV SOLN: 500.0000 mL | Freq: Once | INTRAVENOUS | Status: AC

## 2023-06-12 NOTE — Op Note (Signed)
 [facility] Patient Name: Lydia Joyce Procedure Date: 06/12/2023 10:35 AM MRN: [id number] Endoscopist: Freada Jacobs [place] , , [id number] Age: 73 Referring MD:  Date of Birth: [birth year] Gender: Female Account #: [id number] Procedure:                Colonoscopy Indications:              High risk colon cancer surveillance: Personal                            history of colonic polyps Medicines:                Monitored Anesthesia Care Procedure:                Pre-Anesthesia Assessment:                           - Prior to the procedure, a History and Physical                            was performed, and patient medications and                            allergies were reviewed. The patient's tolerance of                            previous anesthesia was also reviewed. The risks                            and benefits of the procedure and the sedation                            options and risks were discussed with the patient.                            All questions were answered, and informed consent                            was obtained. Prior Anticoagulants: The patient has                            taken no anticoagulant or antiplatelet agents. ASA                            Grade Assessment: II - A patient with mild systemic                            disease. After reviewing the risks and benefits,                            the patient was deemed in satisfactory condition to                            undergo the procedure.  After obtaining informed consent, the colonoscope                            was passed under direct vision. Throughout the                            procedure, the patient's blood pressure, pulse, and                            oxygen saturations were monitored continuously. The                            CF [id number] #[id number] was introduced through the anus                            and advanced to the the  terminal ileum. The                            colonoscopy was performed without difficulty. The                            patient tolerated the procedure well. The quality                            of the bowel preparation was excellent. The                            terminal ileum, ileocecal valve, appendiceal                            orifice, and rectum were photographed. Scope In: 10:48:22 AM Scope Out: 11:03:14 AM Scope Withdrawal Time: 0 hours 10 minutes 47 seconds  Total Procedure Duration: 0 hours 14 minutes 52 seconds  Findings:                 The terminal ileum appeared normal.                           A 4 mm polyp was found in the cecum. The polyp was                            sessile. The polyp was removed with a cold snare.                            Resection and retrieval were complete.                           Non-bleeding internal hemorrhoids were found during                            retroflexion. Complications:            No immediate complications. Estimated Blood Loss:     Estimated blood loss was minimal. Impression:               -  The examined portion of the ileum was normal.                           - One 4 mm polyp in the cecum, removed with a cold                            snare. Resected and retrieved.                           - Non-bleeding internal hemorrhoids. Recommendation:           - Discharge patient to home (with escort).                           - Await pathology results.                           - The findings and recommendations were discussed                            with the patient. Dr Pedro Bourgeois "Normanna" [place],  06/12/2023 11:06:03 AM

## 2023-06-12 NOTE — Progress Notes (Signed)
 Pt's states no medical or surgical changes since previsit or office visit.

## 2023-06-12 NOTE — Progress Notes (Signed)
 Called to room to assist during endoscopic procedure.  Patient ID and intended procedure confirmed with present staff. Received instructions for my participation in the procedure from the performing physician.

## 2023-06-12 NOTE — Patient Instructions (Signed)

## 2023-06-12 NOTE — Progress Notes (Signed)
 GASTROENTEROLOGY PROCEDURE H&P NOTE   Primary Care Physician: Lorella Roles, MD    Reason for Procedure:   History of colon polyps  Plan:    Colonoscopy  Patient is appropriate for endoscopic procedure(s) in the ambulatory ([facility]) setting.  The nature of the procedure, as well as the risks, benefits, and alternatives were carefully and thoroughly reviewed with the patient. Ample time for discussion and questions allowed. The patient understood, was satisfied, and agreed to proceed.     HPI: Lydia Joyce is a 73 y.o. female who presents for colonoscopy for history of colon polyps. Denies blood in stools, changes in bowel habits, or unintentional weight loss. Denies family history of colon cancer.  Colonoscopy 05/18/20: - One less than 1 mm polyp in the rectum, removed with a cold biopsy forceps. Resected and retrieved. - Five 1 mm polyps in the cecum, removed with a cold snare. Resected and retrieved. - The examination was otherwise normal on direct and retroflexion views. Path: 4. Surgical [P], colon, cecum x5, rectal x1, polyp (6) - TUBULAR ADENOMA (X5). - NEGATIVE FOR HIGH GRADE DYSPLASIA.   Past Medical History:  Diagnosis Date   Anemia    Arthritis    Hypertension    Hypothyroidism    Thyroid  disease    Vitamin D  deficiency     Past Surgical History:  Procedure Laterality Date   BREAST LUMPECTOMY WITH RADIOACTIVE SEED LOCALIZATION Left 06/01/2019   Procedure: LEFT BREAST LUMPECTOMY WITH RADIOACTIVE SEED LOCALIZATION;  Surgeon: Caralyn Chandler, MD;  Location: [facility];  Service: General;  Laterality: Left;   COLONOSCOPY  2009   HERNIA REPAIR     umbilical    Prior to Admission medications   Medication Sig Start Date End Date Taking? Authorizing Provider  amLODipine  (NORVASC ) 10 MG tablet Take 10 mg by mouth daily. 06/04/23  Yes [provider]  Ascorbic Acid (VITAMIN C) 1000 MG tablet Take 1,000 mg by mouth daily.    [provider]  aspirin EC 81 MG tablet Take 81 mg by mouth 2 (two) times a week.     [provider]  CALCIUM-MAGNESIUM-ZINC PO Take by mouth.    [provider]  Cholecalciferol (VITAMIN D3) 125 MCG (5000 UT) CAPS Take one daily. 03/01/21   Tonna Frederic, MD  Ferrous Sulfate (IRON) 325 (65 FE) MG TABS Take 1 tablet by mouth daily as needed.     [provider]  levothyroxine  (SYNTHROID ) 50 MCG tablet Take 50 mcg by mouth. 07/18/22   [provider]  Misc Natural Products (GLUCOSAMINE CHOND COMPLEX/MSM) TABS Take 1 tablet by mouth daily.    [provider]  Multiple Vitamin (MULTIVITAMIN) tablet Take 1 tablet by mouth daily.    [provider]  Psyllium (METAMUCIL) 28.3 % POWD Take qd 03/20/20   Lindle Rhea, MD    Current Outpatient Medications  Medication Sig Dispense Refill   amLODipine  (NORVASC ) 10 MG tablet Take 10 mg by mouth daily.     Ascorbic Acid (VITAMIN C) 1000 MG tablet Take 1,000 mg by mouth daily.     aspirin EC 81 MG tablet Take 81 mg by mouth 2 (two) times a week.      CALCIUM-MAGNESIUM-ZINC PO Take by mouth.     Cholecalciferol (VITAMIN D3) 125 MCG (5000 UT) CAPS Take one daily. 90 capsule 1   Ferrous Sulfate (IRON) 325 (65 FE) MG TABS Take 1 tablet by mouth daily as needed.  levothyroxine  (SYNTHROID ) 50 MCG tablet Take 50 mcg by mouth.     Misc Natural Products (GLUCOSAMINE CHOND COMPLEX/MSM) TABS Take 1 tablet by mouth daily.     Multiple Vitamin (MULTIVITAMIN) tablet Take 1 tablet by mouth daily.     Psyllium (METAMUCIL) 28.3 % POWD Take qd     Current Facility-Administered Medications  Medication Dose Route Frequency Provider Last Rate Last Admin   0.9 %  sodium chloride  infusion  500 mL Intravenous Once Daina Drum, MD        Allergies as of 06/12/2023 - Review Complete 06/12/2023  Allergen Reaction Noted   Lisinopril Swelling 09/10/2012    Family History  Problem Relation Age of Onset    Arthritis Mother    Stroke Mother    Arthritis Father    Stroke Father    Hypertension Father    Thyroid  disease Sister    Colon cancer Neg Hx    Pancreatic cancer Neg Hx    Esophageal cancer Neg Hx    Liver cancer Neg Hx    Rectal cancer Neg Hx    Stomach cancer Neg Hx    Colon polyps Neg Hx     Social History   Socioeconomic History   Marital status: Divorced    Spouse name: Not on file   Number of children: Not on file   Years of education: Not on file   Highest education level: Not on file  Occupational History   Occupation: retired  Tobacco Use   Smoking status: Never   Smokeless tobacco: Never  Vaping Use   Vaping status: Never Used  Substance and Sexual Activity   Alcohol use: No   Drug use: No   Sexual activity: Not Currently    Partners: Male  Other Topics Concern   Not on file  Social History Narrative   Exercise-- walk, treadmill 2x a week   Social Drivers of Health   Financial Resource Strain: Low Risk  (03/06/2021)   Overall Financial Resource Strain (CARDIA)    Difficulty of Paying Living Expenses: Not hard at all  Food Insecurity: No Food Insecurity (03/06/2021)   Hunger Vital Sign    Worried About Running Out of Food in the Last Year: Never true    Ran Out of Food in the Last Year: Never true  Transportation Needs: No Transportation Needs (03/06/2021)   PRAPARE - Transportation    Lack of Transportation (Medical): No    Lack of Transportation (Non-Medical): No  Physical Activity: Sufficiently Active (03/06/2021)   Exercise Vital Sign    Days of Exercise per Week: 3 days    Minutes of Exercise per Session: 50 min  Stress: No Stress Concern Present (03/06/2021)   [employer] of Occupational Health - Occupational Stress Questionnaire    Feeling of Stress : Not at all  Social Connections: Moderately Isolated (03/06/2021)   Social Connection and Isolation Panel [NHANES]    Frequency of Communication with Friends and Family: Twice a week     Frequency of Social Gatherings with Friends and Family: Twice a week    Attends Religious Services: More than 4 times per year    Active Member of [employer] or Organizations: No    Attends Banker Meetings: Never    Marital Status: Divorced  Catering manager Violence: Not At Risk (03/06/2021)   Humiliation, Afraid, Rape, and Kick questionnaire    Fear of Current or Ex-Partner: No    Emotionally Abused: No    Physically Abused:  No    Sexually Abused: No    Physical Exam: Vital signs in last 24 hours: BP (!) 153/81   Pulse (!) 57   Temp (!) 97.3 F (36.3 C) (Temporal)   Ht 5\' 7"  (1.702 m)   Wt 165 lb (74.8 kg)   SpO2 100%   BMI 25.84 kg/m  GEN: NAD EYE: Sclerae anicteric ENT: MMM CV: Non-tachycardic Pulm: No increased work of breathing GI: Soft, NT/ND NEURO:  Alert & Oriented   Regino Caprio, MD [facility]  06/12/2023 10:00 AM

## 2023-06-12 NOTE — Progress Notes (Signed)
 Report given to PACU, vss

## 2023-06-15 ENCOUNTER — Telehealth: Payer: Self-pay

## 2023-06-15 NOTE — Telephone Encounter (Signed)
  Follow up Call-     06/12/2023    9:46 AM  Call back number  Post procedure Call Back phone  # [phone]  Permission to leave phone message Yes     Patient questions:  Do you have a fever, pain , or abdominal swelling? No. Pain Score  0 *  Have you tolerated food without any problems? Yes.    Have you been able to return to your normal activities? Yes.    Do you have any questions about your discharge instructions: Diet   No. Medications  No. Follow up visit  No.  Do you have questions or concerns about your Care? No.  Actions: * If pain score is 4 or above: No action needed, pain <4.

## 2023-06-16 ENCOUNTER — Ambulatory Visit: Payer: Self-pay | Admitting: Internal Medicine

## 2023-06-16 LAB — SURGICAL PATHOLOGY

## 2023-07-03 ENCOUNTER — Telehealth: Payer: Self-pay | Admitting: Internal Medicine

## 2023-07-03 NOTE — Telephone Encounter (Signed)
 Pt questioned pathology results. Letter was read a lot to pt that was created by Dr. Rosaline Coma. Letter was mailed to pt via Mail.  Pt verbalized understanding with all questions answered.

## 2023-07-03 NOTE — Telephone Encounter (Signed)
 Patient called and stated that she is needing to speak to the nurse in regards to her pathology report for her procedure. Patient is requesting a call back. Please advise.

## 2023-08-18 ENCOUNTER — Other Ambulatory Visit: Payer: Self-pay | Admitting: Nurse Practitioner

## 2023-08-18 DIAGNOSIS — N6452 Nipple discharge: Secondary | ICD-10-CM

## 2023-08-19 ENCOUNTER — Inpatient Hospital Stay
Admission: RE | Admit: 2023-08-19 | Discharge: 2023-08-19 | Discharge status: Home / Self Care | Source: Ambulatory Visit | Attending: Nurse Practitioner

## 2023-08-19 ENCOUNTER — Ambulatory Visit
Admission: RE | Admit: 2023-08-19 | Discharge: 2023-08-19 | Disposition: A | Discharge status: Home / Self Care | Source: Ambulatory Visit | Attending: Nurse Practitioner | Admitting: Nurse Practitioner

## 2023-08-19 DIAGNOSIS — N6452 Nipple discharge: Secondary | ICD-10-CM

## 2023-08-26 ENCOUNTER — Other Ambulatory Visit: Payer: Self-pay | Admitting: Family Medicine

## 2023-08-26 DIAGNOSIS — N6452 Nipple discharge: Secondary | ICD-10-CM

## 2023-09-01 ENCOUNTER — Encounter: Payer: Self-pay | Admitting: Family Medicine

## 2023-09-04 ENCOUNTER — Ambulatory Visit
Admission: RE | Admit: 2023-09-04 | Discharge: 2023-09-04 | Disposition: A | Discharge status: Home / Self Care | Source: Ambulatory Visit | Attending: Family Medicine | Admitting: Family Medicine

## 2023-09-04 DIAGNOSIS — N6452 Nipple discharge: Secondary | ICD-10-CM

## 2023-09-04 MED ORDER — GADOPICLENOL 0.5 MMOL/ML IV SOLN
7.5000 mL | Freq: Once | INTRAVENOUS | Status: AC | PRN
  Administered 2023-09-04: 7.5 mL via INTRAVENOUS

## 2023-09-15 ENCOUNTER — Other Ambulatory Visit: Payer: Self-pay | Admitting: General Surgery

## 2023-09-15 DIAGNOSIS — R928 Other abnormal and inconclusive findings on diagnostic imaging of breast: Secondary | ICD-10-CM

## 2023-09-21 ENCOUNTER — Ambulatory Visit

## 2023-09-21 ENCOUNTER — Other Ambulatory Visit: Payer: Self-pay | Admitting: General Surgery

## 2023-09-21 ENCOUNTER — Inpatient Hospital Stay
Admission: RE | Admit: 2023-09-21 | Discharge: 2023-09-21 | Disposition: A | Discharge status: Home / Self Care | Source: Ambulatory Visit | Attending: General Surgery | Admitting: General Surgery

## 2023-09-21 DIAGNOSIS — R928 Other abnormal and inconclusive findings on diagnostic imaging of breast: Secondary | ICD-10-CM

## 2023-09-21 MED ORDER — GADOPICLENOL 0.5 MMOL/ML IV SOLN
7.5000 mL | Freq: Once | INTRAVENOUS | Status: AC | PRN
  Administered 2023-09-21: 7.5 mL via INTRAVENOUS

## 2023-12-18 ENCOUNTER — Ambulatory Visit
